# Patient Record
Sex: Female | Born: 1995 | Race: Black or African American | Hispanic: No | Marital: Single | Smoking: Current every day smoker
Health system: Southern US, Community
[De-identification: ages and names within clinical notes are randomized; demographics above are authoritative.]

## PROBLEM LIST (undated history)

## (undated) DIAGNOSIS — F1910 Other psychoactive substance abuse, uncomplicated: Secondary | ICD-10-CM

## (undated) DIAGNOSIS — F419 Anxiety disorder, unspecified: Secondary | ICD-10-CM

## (undated) DIAGNOSIS — J45909 Unspecified asthma, uncomplicated: Secondary | ICD-10-CM

## (undated) HISTORY — PX: KNEE SURGERY: SHX244

---

## 2017-02-16 ENCOUNTER — Emergency Department (HOSPITAL_COMMUNITY): Payer: BLUE CROSS/BLUE SHIELD

## 2017-02-16 ENCOUNTER — Emergency Department (HOSPITAL_COMMUNITY)
Admission: EM | Admit: 2017-02-16 | Discharge: 2017-02-17 | Disposition: A | Discharge status: Home / Self Care | Payer: BLUE CROSS/BLUE SHIELD | Attending: Emergency Medicine | Admitting: Emergency Medicine

## 2017-02-16 ENCOUNTER — Encounter (HOSPITAL_COMMUNITY): Payer: Self-pay | Admitting: Emergency Medicine

## 2017-02-16 DIAGNOSIS — Y999 Unspecified external cause status: Secondary | ICD-10-CM | POA: Diagnosis not present

## 2017-02-16 DIAGNOSIS — J45909 Unspecified asthma, uncomplicated: Secondary | ICD-10-CM | POA: Insufficient documentation

## 2017-02-16 DIAGNOSIS — M25551 Pain in right hip: Secondary | ICD-10-CM

## 2017-02-16 DIAGNOSIS — Y9351 Activity, roller skating (inline) and skateboarding: Secondary | ICD-10-CM | POA: Insufficient documentation

## 2017-02-16 DIAGNOSIS — Y92828 Other wilderness area as the place of occurrence of the external cause: Secondary | ICD-10-CM | POA: Insufficient documentation

## 2017-02-16 DIAGNOSIS — F17200 Nicotine dependence, unspecified, uncomplicated: Secondary | ICD-10-CM | POA: Diagnosis not present

## 2017-02-16 DIAGNOSIS — S79911A Unspecified injury of right hip, initial encounter: Secondary | ICD-10-CM | POA: Diagnosis present

## 2017-02-16 HISTORY — DX: Unspecified asthma, uncomplicated: J45.909

## 2017-02-16 LAB — I-STAT BETA HCG BLOOD, ED (MC, WL, AP ONLY): I-stat hCG, quantitative: <5 m[IU]/mL (ref <5)

## 2017-02-16 MED ORDER — KETOROLAC TROMETHAMINE 60 MG/2ML IM SOLN
60.0000 mg | Freq: Once | INTRAMUSCULAR | Status: AC
  Administered 2017-02-16: 60 mg via INTRAMUSCULAR
  Filled 2017-02-16: qty 2

## 2017-02-16 NOTE — ED Provider Notes (Signed)
[facility] Provider Note   CSN: [id number] Arrival date & time: 02/16/17  4098  By signing my name below, I, Jennifer Walton, attest that this documentation has been prepared under the direction and in the presence of non-physician practitioner, Michela Pitcher, PA-C. Electronically Signed: Modena Walton, Scribe. 02/16/2017. 8:17 PM.  History   Chief Complaint Chief Complaint  Patient presents with  . Hip Pain   The history is provided by the patient. No language interpreter was used.   HPI Comments: Jennifer Walton is a 21 y.o. female who presents to the Emergency Department complaining of constant moderate sharp right hip pain that started about 2 weeks ago. She states while she was skateboarding downhill her skateboard came out from under her, she slipped, rolled over a few times, and fell onto her right hip. She started having pain 2 days after fall. Denies head injury or LOC and pt was ambulatory immediately after fall. She came to the ED since her pain worsened 2 days ago. She took Advil PTA today with some relief. Her pain is exacerbated by any movement and weight-bearing and relieved by staying motionless.  She reports her pain was initially a 2/10 and currently is 7/10 groin pain that does not radiate. Denies any fever, chills, chest pain, SOB, nausea, vomiting, neck pain, back pain, visual disturbance, abdominal pain, urinary symptoms, headache, or other complaints at this time.  Past Medical History:  Diagnosis Date  . Asthma     There are no active problems to display for this patient.   Past Surgical History:  Procedure Laterality Date  . KNEE SURGERY      OB History    Gravida Para Term Preterm AB Living   1             SAB TAB Ectopic Multiple Live Births                   Home Medications    Prior to Admission medications   Not on File    Family History No family history on file.  Social History Social History  Substance Use Topics  . Smoking status:  Current Every Day Smoker  . Smokeless tobacco: Never Used  . Alcohol use Yes     Allergies   Patient has no allergy information on record.   Review of Systems Review of Systems  Constitutional: Negative for chills and fever.  Eyes: Negative for visual disturbance.  Respiratory: Negative for shortness of breath.   Cardiovascular: Negative for chest pain.  Gastrointestinal: Negative for abdominal pain, blood in stool, diarrhea, nausea and vomiting.  Genitourinary: Negative for dysuria and hematuria.  Musculoskeletal: Positive for myalgias (Right hip). Negative for back pain and neck pain.  Neurological: Negative for dizziness, syncope, light-headedness and headaches.     Physical Exam Updated Vital Signs BP 132/74 (BP Location: Left Arm)   Pulse 79   Temp 99 F (37.2 C) (Oral)   Resp 16   Ht 5' 9.5" (1.765 m)   Wt 147 lb (66.7 kg)   LMP 01/31/2017 (Exact Date)   SpO2 100%   BMI 21.40 kg/m   Physical Exam  Constitutional: She is oriented to person, place, and time. She appears well-developed and well-nourished. No distress.  HENT:  Head: Normocephalic and atraumatic.  Eyes: Conjunctivae are normal. Pupils are equal, round, and reactive to light.  Neck: Normal range of motion. Neck supple.  No midline CSP tenderness, no paraspinal muscle spasm or tenderness.   Cardiovascular: Normal  rate, regular rhythm, normal heart sounds and intact distal pulses.   2+ dp/pt pulses bl, negative Homan's bl  Pulmonary/Chest: Effort normal and breath sounds normal.  Abdominal: Soft. Bowel sounds are normal. She exhibits no distension. There is no tenderness.  Musculoskeletal: Normal range of motion. She exhibits no edema, tenderness or deformity.  No midline LSP ttp. No paraspinal muscle ttp. No deformity, or crepitus noted. Full ROM of right hip. No pain on palpation of hip or groin. No masses or bulging noted. No erythema. Pain on extension of hip, walking, standing, and forward flexion.  Compartments soft.  Neurological: She is alert and oriented to person, place, and time. No cranial nerve deficit or sensory deficit.  No facial droop, fluent speech, antalgic gait, but able to heel walk and toe walk. Sensation intact globally  Skin: Skin is warm and dry. Capillary refill takes less than 2 seconds.  Well-healing superficial abrasion to right posterior hip, no erythema, drainage or bleeding. No other signs of trauma or wound noted.   Psychiatric: She has a normal mood and affect. Her behavior is normal.  Nursing note and vitals reviewed.    ED Treatments / Results  DIAGNOSTIC STUDIES: Oxygen Saturation is 100% on RA, normal by my interpretation.    COORDINATION OF CARE: 8:21 PM- Pt advised of plan for treatment and pt agrees.  Labs (all labs ordered are listed, but only abnormal results are displayed) Labs Reviewed  I-STAT BETA HCG BLOOD, ED ([facility], [facility], [facility] ONLY)    EKG  EKG Interpretation None       Radiology Ct Pelvis Wo Contrast  Result Date: 02/16/2017 CLINICAL DATA:  Acute onset of right hip pain, after fall off skateboard. Initial encounter. EXAM: CT PELVIS WITHOUT CONTRAST TECHNIQUE: Multidetector CT imaging of the pelvis was performed following the standard protocol without intravenous contrast. COMPARISON:  None. FINDINGS: Urinary Tract: The bladder is mildly distended and grossly unremarkable. Bowel: Visualized small and large bowel loops are grossly unremarkable in appearance. The appendix is normal in caliber, without evidence of appendicitis. Vascular/Lymphatic: The visualized vasculature is grossly unremarkable. No calcific atherosclerotic disease is seen. No retroperitoneal or pelvic sidewall lymphadenopathy is appreciated. Reproductive: The uterus is unremarkable in appearance. The ovaries are relatively symmetric. No suspicious adnexal masses are seen. Other: Mild soft tissue injury is noted at the lower right flank, with a tiny focus of air 4 cm deep to  the skin surface. Would correlate for any underlying puncture wound. Musculoskeletal: No acute osseous abnormalities are identified. The visualized musculature is unremarkable in appearance. IMPRESSION: 1. No evidence of fracture. 2. Mild soft tissue injury at the right lower flank, with a tiny focus of air 4 cm deep to the skin surface. Would correlate for any underlying puncture wound. Electronically Signed   By: Roanna Raider M.D.   On: 02/16/2017 23:29   Dg Hip Unilat With Pelvis 2-3 Views Right  Result Date: 02/16/2017 CLINICAL DATA:  Right hip pain after fall from skateboard 2 weeks ago. No improvement. Painful to bear weight or to walk. EXAM: DG HIP (WITH OR WITHOUT PELVIS) 2-3V RIGHT COMPARISON:  None. FINDINGS: There is no evidence of hip fracture or dislocation. There is no evidence of arthropathy or other focal bone abnormality. IMPRESSION: Negative. Electronically Signed   By: Burman Nieves M.D.   On: 02/16/2017 21:13    Procedures Procedures (including critical care time)  Medications Ordered in ED Medications  ketorolac (TORADOL) injection 60 mg (60 mg Intramuscular Given 02/16/17 2112)  Initial Impression / Assessment and Plan / ED Course  I have reviewed the triage vital signs and the nursing notes.  Pertinent labs & imaging results that were available during my care of the patient were reviewed by me and considered in my medical decision making (see chart for details).     Pt with 12 day history progressively worsening right hip pain after falling off skateboard. Afebrile, VSS, neurovascularly intact. Strength intact. Patient X-Ray negative for obvious fracture or dislocation. Obtained CT pelvis to definitively rule out fracture which was negative. Low suspicion AVN of the femoral head, femoral hernia, or DVT. Pain managed in ED. Pt advised to follow up with orthopedics for re-evaluation for possibility of missed fracture diagnosis. Patient given crutches while in ED,  conservative therapy recommended and discussed. Patient will be dc home & is agreeable with above plan.  Final Clinical Impressions(s) / ED Diagnoses   Final diagnoses:  Right hip pain    New Prescriptions There are no discharge medications for this patient.  I personally performed the services described in this documentation, which was scribed in my presence. The recorded information has been reviewed and is accurate.     Jeanie Sewer, PA-C 02/18/17 1358    Canary Brim Tegeler, MD 02/18/17 [phone]

## 2017-02-16 NOTE — ED Triage Notes (Signed)
Pt st's she fell off her skate board 2 weeks ago and started having pain in right hip 2 days later.  Pt st's pain in right hip has not improved and is very pain to bear wt or walk

## 2017-02-16 NOTE — ED Notes (Signed)
Patient transported to CT 

## 2017-06-01 ENCOUNTER — Ambulatory Visit (HOSPITAL_COMMUNITY)
Admission: EM | Admit: 2017-06-01 | Discharge: 2017-06-01 | Disposition: A | Discharge status: Home / Self Care | Payer: BLUE CROSS/BLUE SHIELD | Attending: Family Medicine | Admitting: Family Medicine

## 2017-06-01 ENCOUNTER — Encounter (HOSPITAL_COMMUNITY): Payer: Self-pay | Admitting: Emergency Medicine

## 2017-06-01 DIAGNOSIS — G43A0 Cyclical vomiting, not intractable: Secondary | ICD-10-CM | POA: Diagnosis not present

## 2017-06-01 DIAGNOSIS — R1115 Cyclical vomiting syndrome unrelated to migraine: Secondary | ICD-10-CM

## 2017-06-01 LAB — POCT PREGNANCY, URINE: PREG TEST UR: NEGATIVE

## 2017-06-01 MED ORDER — ONDANSETRON 4 MG PO TBDP: 4.0000 mg | ORAL_TABLET | Freq: Three times a day (TID) | ORAL | 0 refills | Status: DC | PRN

## 2017-06-01 NOTE — ED Provider Notes (Signed)
CSN: [id number]     Arrival date & time 06/01/17  1854 History   None    Chief Complaint  Patient presents with  . Emesis   (Consider location/radiation/quality/duration/timing/severity/associated sxs/prior Treatment) Patient c/o nausea. She states it may be from her bcp's.   The history is provided by the patient.  Emesis  Severity:  Moderate Duration:  1 day Timing:  Constant Worsened by:  Nothing Ineffective treatments:  None tried   Past Medical History:  Diagnosis Date  . Asthma    Past Surgical History:  Procedure Laterality Date  . KNEE SURGERY     No family history on file. Social History  Substance Use Topics  . Smoking status: Current Every Day Smoker    Packs/day: 0.20    Types: Cigarettes  . Smokeless tobacco: Never Used  . Alcohol use Yes   OB History    Gravida Para Term Preterm AB Living   1             SAB TAB Ectopic Multiple Live Births                 Review of Systems  Constitutional: Negative.   HENT: Negative.   Eyes: Negative.   Respiratory: Negative.   Cardiovascular: Negative.   Gastrointestinal: Positive for vomiting.  Endocrine: Negative.   Genitourinary: Negative.   Allergic/Immunologic: Negative.   Neurological: Negative.     Allergies  Patient has no known allergies.  Home Medications   Prior to Admission medications   Medication Sig Start Date End Date Taking? Authorizing Provider  Etonogestrel (NEXPLANON [place]) Inject into the skin.    [provider]  ondansetron (ZOFRAN ODT) 4 MG disintegrating tablet Take 1 tablet (4 mg total) by mouth every 8 (eight) hours as needed for nausea or vomiting. 06/01/17   Deatra Canterxford, Roberth Berling J, FNP   Meds Ordered and Administered this Visit  Medications - No data to display  BP 136/76   Pulse 75   Temp 98.8 F (37.1 C) (Oral)   Resp 16   Ht 5\' 9"  (1.753 m)   Wt 131 lb (59.4 kg)   LMP 04/06/2017   SpO2 100%   BMI 19.35 kg/m  No data found.   Physical Exam   Constitutional: She appears well-developed and well-nourished.  HENT:  Head: Normocephalic and atraumatic.  Eyes: Pupils are equal, round, and reactive to light. Conjunctivae and EOM are normal.  Neck: Normal range of motion. Neck supple.  Cardiovascular: Normal rate, regular rhythm and normal heart sounds.   Pulmonary/Chest: Effort normal and breath sounds normal.  Abdominal: Soft. Bowel sounds are normal.  Nursing note and vitals reviewed.   Urgent Care Course     Procedures (including critical care time)  Labs Review Labs Reviewed  POCT PREGNANCY, URINE    Imaging Review No results found.   Visual Acuity Review  Right Eye Distance:   Left Eye Distance:   Bilateral Distance:    Right Eye Near:   Left Eye Near:    Bilateral Near:         MDM   1. Non-intractable cyclical vomiting with nausea    Zofran ODT 4mg  one po tid prn #21   Deatra CanterOxford, Angelin Cutrone J, FNP 06/01/17 2016

## 2017-06-01 NOTE — ED Triage Notes (Signed)
PT reports she got nexplanon implanted 6 weeks ago. Two weeks after that she began having intermittent vomiting. PT reports some diarrhea as well

## 2017-12-01 ENCOUNTER — Encounter (HOSPITAL_COMMUNITY): Payer: Self-pay | Admitting: Family Medicine

## 2017-12-01 ENCOUNTER — Ambulatory Visit (HOSPITAL_COMMUNITY)
Admission: EM | Admit: 2017-12-01 | Discharge: 2017-12-01 | Disposition: A | Discharge status: Home / Self Care | Payer: BLUE CROSS/BLUE SHIELD | Attending: Family Medicine | Admitting: Family Medicine

## 2017-12-01 DIAGNOSIS — Z202 Contact with and (suspected) exposure to infections with a predominantly sexual mode of transmission: Secondary | ICD-10-CM | POA: Insufficient documentation

## 2017-12-01 DIAGNOSIS — Z3202 Encounter for pregnancy test, result negative: Secondary | ICD-10-CM

## 2017-12-01 DIAGNOSIS — Z711 Person with feared health complaint in whom no diagnosis is made: Secondary | ICD-10-CM

## 2017-12-01 DIAGNOSIS — Z113 Encounter for screening for infections with a predominantly sexual mode of transmission: Secondary | ICD-10-CM | POA: Diagnosis not present

## 2017-12-01 DIAGNOSIS — F17210 Nicotine dependence, cigarettes, uncomplicated: Secondary | ICD-10-CM | POA: Insufficient documentation

## 2017-12-01 DIAGNOSIS — Z79899 Other long term (current) drug therapy: Secondary | ICD-10-CM | POA: Diagnosis not present

## 2017-12-01 DIAGNOSIS — Z8619 Personal history of other infectious and parasitic diseases: Secondary | ICD-10-CM | POA: Diagnosis not present

## 2017-12-01 LAB — POCT URINALYSIS DIP (DEVICE)
BILIRUBIN URINE: NEGATIVE
Glucose, UA: NEGATIVE mg/dL
HGB URINE DIPSTICK: NEGATIVE
KETONES UR: NEGATIVE mg/dL
Leukocytes, UA: NEGATIVE
Nitrite: NEGATIVE
PH: 7 (ref 5.0–8.0)
Protein, ur: NEGATIVE mg/dL
Specific Gravity, Urine: 1.02 (ref 1.005–1.030)
Urobilinogen, UA: 0.2 mg/dL (ref 0.0–1.0)

## 2017-12-01 LAB — POCT PREGNANCY, URINE: PREG TEST UR: NEGATIVE

## 2017-12-01 MED ORDER — CEFTRIAXONE SODIUM 250 MG IJ SOLR
250.0000 mg | Freq: Once | INTRAMUSCULAR | Status: AC
  Administered 2017-12-01: 250 mg via INTRAMUSCULAR

## 2017-12-01 MED ORDER — AZITHROMYCIN 250 MG PO TABS
ORAL_TABLET | ORAL | Status: AC
  Filled 2017-12-01: qty 4

## 2017-12-01 MED ORDER — CEFTRIAXONE SODIUM 250 MG IJ SOLR
INTRAMUSCULAR | Status: AC
  Filled 2017-12-01: qty 250

## 2017-12-01 MED ORDER — AZITHROMYCIN 250 MG PO TABS
1000.0000 mg | ORAL_TABLET | Freq: Once | ORAL | Status: AC
  Administered 2017-12-01: 1000 mg via ORAL

## 2017-12-01 NOTE — Discharge Instructions (Signed)
We have treated you for gonorrhea and chlamydia.  Will notify you of any positive findings and if any changes to treatment are needed.   Withhold from intercourse for the next week. Please use condoms to prevent spread of std's.

## 2017-12-01 NOTE — ED Provider Notes (Signed)
[facility]    CSN: [id number] Arrival date & time: 12/01/17  1009     History   Chief Complaint Chief Complaint  Patient presents with  . Exposure to STD    HPI Jennifer Walton is a 22 y.o. female.   [name] presents with concerns about STD exposure. States her boyfriend has developed urinary symptoms and is currently being tested. She has 3 partners, does not use condoms. States has noted vaginal odor. Without increased vaginal discharge, itching or burning. Denies urinary symptoms, back pain, abdominal pain or fevers. Has had chlamydia in the past. Takes zoloft daily, otherwise no daily medications.    ROS per HPI.       Past Medical History:  Diagnosis Date  . Asthma     There are no active problems to display for this patient.   Past Surgical History:  Procedure Laterality Date  . KNEE SURGERY      OB History    Gravida Para Term Preterm AB Living   1             SAB TAB Ectopic Multiple Live Births                   Home Medications    Prior to Admission medications   Medication Sig Start Date End Date Taking? Authorizing Provider  sertraline (ZOLOFT) 25 MG tablet Take 25 mg by mouth daily.   Yes [provider]  Etonogestrel (NEXPLANON [place]) Inject into the skin.    [provider]  ondansetron (ZOFRAN ODT) 4 MG disintegrating tablet Take 1 tablet (4 mg total) by mouth every 8 (eight) hours as needed for nausea or vomiting. 06/01/17   Deatra Canter, FNP    Family History History reviewed. No pertinent family history.  Social History Social History   Tobacco Use  . Smoking status: Current Every Day Smoker    Packs/day: 0.20    Types: Cigarettes  . Smokeless tobacco: Never Used  Substance Use Topics  . Alcohol use: Yes  . Drug use: No     Allergies   Patient has no known allergies.   Review of Systems Review of Systems   Physical Exam Triage Vital Signs ED Triage Vitals [12/01/17 1108]  Enc Vitals Group      BP 136/80     Pulse Rate 71     Resp 18     Temp 98.5 F (36.9 C)     Temp src      SpO2 100 %     Weight      Height      Head Circumference      Peak Flow      Pain Score      Pain Loc      Pain Edu?      Excl. in GC?    No data found.  Updated Vital Signs BP 136/80   Pulse 71   Temp 98.5 F (36.9 C)   Resp 18   SpO2 100%   Breastfeeding? Unknown   Visual Acuity Right Eye Distance:   Left Eye Distance:   Bilateral Distance:    Right Eye Near:   Left Eye Near:    Bilateral Near:     Physical Exam  Constitutional: She is oriented to person, place, and time. She appears well-developed and well-nourished. No distress.  Cardiovascular: Normal rate, regular rhythm and normal heart sounds.  Pulmonary/Chest: Effort normal and breath sounds normal.  Abdominal: Soft. She  exhibits no mass. There is no tenderness. There is no rebound and no guarding.  Genitourinary:  Genitourinary Comments: Denies lesions; discharge; without pain; gu exam deferred   Neurological: She is alert and oriented to person, place, and time.  Skin: Skin is warm and dry.     UC Treatments / Results  Labs (all labs ordered are listed, but only abnormal results are displayed) Labs Reviewed  URINE CYTOLOGY ANCILLARY ONLY    EKG  EKG Interpretation None       Radiology No results found.  Procedures Procedures (including critical care time)  Medications Ordered in UC Medications  azithromycin (ZITHROMAX) tablet 1,000 mg (not administered)  cefTRIAXone (ROCEPHIN) injection 250 mg (not administered)     Initial Impression / Assessment and Plan / UC Course  I have reviewed the triage vital signs and the nursing notes.  Pertinent labs & imaging results that were available during my care of the patient were reviewed by me and considered in my medical decision making (see chart for details).     Patient agrees to treatment with azithromycin and rocephin empirically at this  time. Urine cytology pending. Will notify of any positive findings and if any changes to treatment are needed.  Withhold from intercourse for the next week. Encouraged condom use. If symptoms worsen or do not improve in the next week to return to be seen or to follow up with PCP. Patient verbalized understanding and agreeable to plan.    Final Clinical Impressions(s) / UC Diagnoses   Final diagnoses:  Concern about STD in female without diagnosis    ED Discharge Orders    None       Controlled Substance Prescriptions [place] Controlled Substance Registry consulted? Not Applicable   Georgetta HaberBurky, Maury Groninger B, NP 12/01/17 1129

## 2017-12-01 NOTE — ED Triage Notes (Signed)
Pt reports she is here because she was exposed to chlamydia.

## 2017-12-02 LAB — URINE CYTOLOGY ANCILLARY ONLY
Chlamydia: POSITIVE — AB
Neisseria Gonorrhea: NEGATIVE
Trichomonas: NEGATIVE

## 2017-12-04 ENCOUNTER — Emergency Department (HOSPITAL_COMMUNITY)
Admission: EM | Admit: 2017-12-04 | Discharge: 2017-12-05 | Disposition: A | Discharge status: Other Acute Inpatient Hospital | Payer: BLUE CROSS/BLUE SHIELD | Attending: Emergency Medicine | Admitting: Emergency Medicine

## 2017-12-04 ENCOUNTER — Encounter (HOSPITAL_COMMUNITY): Payer: Self-pay | Admitting: Emergency Medicine

## 2017-12-04 DIAGNOSIS — Z79899 Other long term (current) drug therapy: Secondary | ICD-10-CM | POA: Insufficient documentation

## 2017-12-04 DIAGNOSIS — J45909 Unspecified asthma, uncomplicated: Secondary | ICD-10-CM | POA: Insufficient documentation

## 2017-12-04 DIAGNOSIS — F332 Major depressive disorder, recurrent severe without psychotic features: Secondary | ICD-10-CM | POA: Diagnosis not present

## 2017-12-04 DIAGNOSIS — F17210 Nicotine dependence, cigarettes, uncomplicated: Secondary | ICD-10-CM | POA: Insufficient documentation

## 2017-12-04 DIAGNOSIS — F322 Major depressive disorder, single episode, severe without psychotic features: Secondary | ICD-10-CM

## 2017-12-04 DIAGNOSIS — R45851 Suicidal ideations: Secondary | ICD-10-CM | POA: Diagnosis not present

## 2017-12-04 DIAGNOSIS — F329 Major depressive disorder, single episode, unspecified: Secondary | ICD-10-CM | POA: Diagnosis present

## 2017-12-04 LAB — COMPREHENSIVE METABOLIC PANEL
ALT: 13 U/L — AB (ref 14–54)
AST: 23 U/L (ref 15–41)
Albumin: 4.6 g/dL (ref 3.5–5.0)
Alkaline Phosphatase: 40 U/L (ref 38–126)
Anion gap: 12 (ref 5–15)
BILIRUBIN TOTAL: 1.3 mg/dL — AB (ref 0.3–1.2)
BUN: 5 mg/dL — AB (ref 6–20)
CALCIUM: 9.7 mg/dL (ref 8.9–10.3)
CO2: 24 mmol/L (ref 22–32)
CREATININE: 0.62 mg/dL (ref 0.44–1.00)
Chloride: 102 mmol/L (ref 101–111)
GFR calc Af Amer: >60 mL/min (ref >60)
Glucose, Bld: 77 mg/dL (ref 65–99)
Potassium: 4 mmol/L (ref 3.5–5.1)
Sodium: 138 mmol/L (ref 135–145)
TOTAL PROTEIN: 7.6 g/dL (ref 6.5–8.1)

## 2017-12-04 LAB — ACETAMINOPHEN LEVEL: Acetaminophen (Tylenol), Serum: <10 ug/mL — ABNORMAL LOW (ref 10–30)

## 2017-12-04 LAB — CBC
HCT: 40.9 % (ref 36.0–46.0)
Hemoglobin: 13.7 g/dL (ref 12.0–15.0)
MCH: 31.6 pg (ref 26.0–34.0)
MCHC: 33.5 g/dL (ref 30.0–36.0)
MCV: 94.5 fL (ref 78.0–100.0)
PLATELETS: 270 10*3/uL (ref 150–400)
RBC: 4.33 MIL/uL (ref 3.87–5.11)
RDW: 12.3 % (ref 11.5–15.5)
WBC: 8.7 10*3/uL (ref 4.0–10.5)

## 2017-12-04 LAB — RAPID URINE DRUG SCREEN, HOSP PERFORMED
Amphetamines: NOT DETECTED
Barbiturates: NOT DETECTED
Benzodiazepines: POSITIVE — AB
Cocaine: NOT DETECTED
Opiates: NOT DETECTED
Tetrahydrocannabinol: NOT DETECTED

## 2017-12-04 LAB — I-STAT BETA HCG BLOOD, ED (MC, WL, AP ONLY): I-stat hCG, quantitative: <5 m[IU]/mL (ref <5)

## 2017-12-04 LAB — SALICYLATE LEVEL: Salicylate Lvl: <7 mg/dL (ref 2.8–30.0)

## 2017-12-04 LAB — ETHANOL: ALCOHOL ETHYL (B): 32 mg/dL — AB (ref <10)

## 2017-12-04 MED ORDER — ACETAMINOPHEN 325 MG PO TABS: 650.0000 mg | ORAL_TABLET | ORAL | Status: DC | PRN

## 2017-12-04 MED ORDER — ZOLPIDEM TARTRATE 5 MG PO TABS
5.0000 mg | ORAL_TABLET | Freq: Every evening | ORAL | Status: DC | PRN
Start: 2017-12-04 — End: 2017-12-05

## 2017-12-04 MED ORDER — ONDANSETRON HCL 4 MG PO TABS: 4.0000 mg | ORAL_TABLET | Freq: Three times a day (TID) | ORAL | Status: DC | PRN

## 2017-12-04 NOTE — ED Notes (Signed)
Meal Tray ordered.  

## 2017-12-04 NOTE — ED Notes (Signed)
Sitter at bedside.

## 2017-12-04 NOTE — ED Provider Notes (Signed)
[facility] EMERGENCY DEPARTMENT Provider Note   CSN: [id number] Arrival date & time: 12/04/17  0110     History   Chief Complaint Chief Complaint  Patient presents with  . Suicidal    HPI Jennifer Walton is a 22 y.o. female.  HPI   22 year old female with history of depression comes in with chief complaint of suicidal ideation.  Patient reports that she has been feeling depressed over the last few days, and now having suicidal thoughts.  Patient has not required admission to a psychiatric ward in the last 1 year, and she denies any previous attempts.  Patient denies any active substance abuse.  Patient also denies any specific trigger for her decline.  Pt has no chest pain, dib, abd pain.   Past Medical History:  Diagnosis Date  . Asthma     There are no active problems to display for this patient.   Past Surgical History:  Procedure Laterality Date  . KNEE SURGERY      OB History    Gravida Para Term Preterm AB Living   1             SAB TAB Ectopic Multiple Live Births                   Home Medications    Prior to Admission medications   Medication Sig Start Date End Date Taking? Authorizing Provider  Etonogestrel (NEXPLANON [place]) Inject into the skin.    [provider]  ondansetron (ZOFRAN ODT) 4 MG disintegrating tablet Take 1 tablet (4 mg total) by mouth every 8 (eight) hours as needed for nausea or vomiting. 06/01/17   Deatra Canter, FNP  sertraline (ZOLOFT) 25 MG tablet Take 25 mg by mouth daily.    [provider]    Family History No family history on file.  Social History Social History   Tobacco Use  . Smoking status: Current Every Day Smoker    Packs/day: 0.20    Types: Cigarettes  . Smokeless tobacco: Never Used  Substance Use Topics  . Alcohol use: Yes  . Drug use: No     Allergies   Patient has no known allergies.   Review of Systems Review of Systems  Constitutional: Positive for activity  change.  Respiratory: Negative for shortness of breath.   Cardiovascular: Negative for chest pain.  Gastrointestinal: Negative for abdominal pain.  Psychiatric/Behavioral: Positive for dysphoric mood and suicidal ideas.  All other systems reviewed and are negative.    Physical Exam Updated Vital Signs BP 122/76 (BP Location: Right Arm)   Pulse 86   Temp 97.7 F (36.5 C) (Oral)   Resp 15   Ht 5\' 9"  (1.753 m)   Wt 63.5 kg (140 lb)   SpO2 100%   BMI 20.67 kg/m   Physical Exam  Constitutional: She is oriented to person, place, and time. She appears well-developed.  HENT:  Head: Normocephalic and atraumatic.  Eyes: EOM are normal.  Neck: Normal range of motion. Neck supple.  Cardiovascular: Normal rate.  Pulmonary/Chest: Effort normal.  Abdominal: Bowel sounds are normal.  Neurological: She is alert and oriented to person, place, and time.  Skin: Skin is warm and dry.  Psychiatric:  Flat affect  Nursing note and vitals reviewed.    ED Treatments / Results  Labs (all labs ordered are listed, but only abnormal results are displayed) Labs Reviewed  COMPREHENSIVE METABOLIC PANEL - Abnormal; Notable for the following components:  Result Value   BUN 5 (*)    ALT 13 (*)    Total Bilirubin 1.3 (*)    All other components within normal limits  ETHANOL - Abnormal; Notable for the following components:   Alcohol, Ethyl (B) 32 (*)    All other components within normal limits  ACETAMINOPHEN LEVEL - Abnormal; Notable for the following components:   Acetaminophen (Tylenol), Serum <10 (*)    All other components within normal limits  RAPID URINE DRUG SCREEN, HOSP PERFORMED - Abnormal; Notable for the following components:   Benzodiazepines POSITIVE (*)    All other components within normal limits  SALICYLATE LEVEL  CBC  I-STAT BETA HCG BLOOD, ED ([facility], [facility], [facility] ONLY)    EKG  EKG Interpretation None       Radiology No results found.  Procedures Procedures  (including critical care time)  Medications Ordered in ED Medications  acetaminophen (TYLENOL) tablet 650 mg (not administered)  zolpidem (AMBIEN) tablet 5 mg (not administered)  ondansetron (ZOFRAN) tablet 4 mg (not administered)     Initial Impression / Assessment and Plan / ED Course  I have reviewed the triage vital signs and the nursing notes.  Pertinent labs & imaging results that were available during my care of the patient were reviewed by me and considered in my medical decision making (see chart for details).     22 year old comes in with chief complaint of suicidal thoughts and worsening depression.  Patient has been noncompliant with her medications over the past few days.  She denies any acute triggers for her current situation.  Patient is medically cleared for psychiatry evaluation.  Final Clinical Impressions(s) / ED Diagnoses   Final diagnoses:  Suicidal ideation  Current severe episode of major depressive disorder without psychotic features, unspecified whether recurrent [facility](HCC)    ED Discharge Orders    None       Derwood KaplanNanavati, Dalan Cowger, MD 12/04/17 [phone]

## 2017-12-04 NOTE — ED Notes (Signed)
Pt ambulatory to F11 - wearing burgundy scrubs.

## 2017-12-04 NOTE — ED Notes (Signed)
Pt's female friend who stated he has her belongings and advised he will come visit pt at 1730 visitation time.

## 2017-12-04 NOTE — ED Notes (Signed)
Pt given paper and pencil upon request.
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
## 2017-12-04 NOTE — ED Notes (Signed)
Called staffing for sitter 

## 2017-12-04 NOTE — ED Triage Notes (Signed)
Pt presents with female friend, pt appears very drowsy, stating she had taken 1mg  Xanax tonight. Pt reports she does have hx of depression, she had a bad day yesterday, felt as if she was "spiraling" and felt "very suicidal" no plan. Pt states she needs to be put on a 72 hr hold for safety.

## 2017-12-04 NOTE — ED Notes (Signed)
Patient made a call [phone] at [phone].

## 2017-12-04 NOTE — ED Notes (Signed)
Pt clothing and shoes inventoried. No valuables present.
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
## 2017-12-05 ENCOUNTER — Other Ambulatory Visit: Payer: Self-pay

## 2017-12-05 ENCOUNTER — Inpatient Hospital Stay (HOSPITAL_COMMUNITY)
Admission: AD | Admit: 2017-12-05 | Discharge: 2017-12-08 | DRG: 885 | Disposition: A | Discharge status: Home / Self Care | Payer: BLUE CROSS/BLUE SHIELD | Source: Intra-hospital | Attending: Psychiatry | Admitting: Psychiatry

## 2017-12-05 ENCOUNTER — Encounter (HOSPITAL_COMMUNITY): Payer: Self-pay | Admitting: *Deleted

## 2017-12-05 DIAGNOSIS — G4710 Hypersomnia, unspecified: Secondary | ICD-10-CM | POA: Diagnosis present

## 2017-12-05 DIAGNOSIS — Z818 Family history of other mental and behavioral disorders: Secondary | ICD-10-CM | POA: Diagnosis not present

## 2017-12-05 DIAGNOSIS — F419 Anxiety disorder, unspecified: Secondary | ICD-10-CM | POA: Diagnosis not present

## 2017-12-05 DIAGNOSIS — Z62810 Personal history of physical and sexual abuse in childhood: Secondary | ICD-10-CM | POA: Diagnosis present

## 2017-12-05 DIAGNOSIS — J45909 Unspecified asthma, uncomplicated: Secondary | ICD-10-CM | POA: Diagnosis present

## 2017-12-05 DIAGNOSIS — Y901 Blood alcohol level of 20-39 mg/100 ml: Secondary | ICD-10-CM | POA: Diagnosis present

## 2017-12-05 DIAGNOSIS — Z811 Family history of alcohol abuse and dependence: Secondary | ICD-10-CM

## 2017-12-05 DIAGNOSIS — F1010 Alcohol abuse, uncomplicated: Secondary | ICD-10-CM | POA: Diagnosis present

## 2017-12-05 DIAGNOSIS — Z9689 Presence of other specified functional implants: Secondary | ICD-10-CM | POA: Diagnosis present

## 2017-12-05 DIAGNOSIS — F410 Panic disorder [episodic paroxysmal anxiety] without agoraphobia: Secondary | ICD-10-CM | POA: Diagnosis present

## 2017-12-05 DIAGNOSIS — F17290 Nicotine dependence, other tobacco product, uncomplicated: Secondary | ICD-10-CM | POA: Diagnosis present

## 2017-12-05 DIAGNOSIS — Z566 Other physical and mental strain related to work: Secondary | ICD-10-CM

## 2017-12-05 DIAGNOSIS — Z79899 Other long term (current) drug therapy: Secondary | ICD-10-CM | POA: Diagnosis not present

## 2017-12-05 DIAGNOSIS — Z598 Other problems related to housing and economic circumstances: Secondary | ICD-10-CM | POA: Diagnosis not present

## 2017-12-05 DIAGNOSIS — F332 Major depressive disorder, recurrent severe without psychotic features: Principal | ICD-10-CM | POA: Diagnosis present

## 2017-12-05 DIAGNOSIS — Z630 Problems in relationship with spouse or partner: Secondary | ICD-10-CM | POA: Diagnosis not present

## 2017-12-05 DIAGNOSIS — Z62819 Personal history of unspecified abuse in childhood: Secondary | ICD-10-CM | POA: Diagnosis not present

## 2017-12-05 DIAGNOSIS — Z793 Long term (current) use of hormonal contraceptives: Secondary | ICD-10-CM | POA: Diagnosis not present

## 2017-12-05 DIAGNOSIS — G4700 Insomnia, unspecified: Secondary | ICD-10-CM | POA: Diagnosis present

## 2017-12-05 DIAGNOSIS — R45851 Suicidal ideations: Secondary | ICD-10-CM | POA: Diagnosis present

## 2017-12-05 DIAGNOSIS — Z5689 Other problems related to employment: Secondary | ICD-10-CM | POA: Diagnosis not present

## 2017-12-05 LAB — URINE CYTOLOGY ANCILLARY ONLY: Candida vaginitis: NEGATIVE

## 2017-12-05 MED ORDER — ONDANSETRON HCL 4 MG PO TABS: 4.0000 mg | ORAL_TABLET | Freq: Three times a day (TID) | ORAL | Status: DC | PRN

## 2017-12-05 MED ORDER — TRAZODONE HCL 50 MG PO TABS
50.0000 mg | ORAL_TABLET | Freq: Every evening | ORAL | Status: DC | PRN
  Administered 2017-12-05 – 2017-12-07 (×2): 50 mg via ORAL
  Filled 2017-12-05 (×2): qty 1

## 2017-12-05 MED ORDER — ALUM & MAG HYDROXIDE-SIMETH 200-200-20 MG/5ML PO SUSP: 30.0000 mL | ORAL | Status: DC | PRN

## 2017-12-05 MED ORDER — ACETAMINOPHEN 325 MG PO TABS
650.0000 mg | ORAL_TABLET | Freq: Four times a day (QID) | ORAL | Status: DC | PRN
Start: 2017-12-05 — End: 2017-12-08

## 2017-12-05 MED ORDER — MAGNESIUM HYDROXIDE 400 MG/5ML PO SUSP: 30.0000 mL | Freq: Every day | ORAL | Status: DC | PRN

## 2017-12-05 NOTE — ED Notes (Signed)
[facility]

## 2017-12-05 NOTE — ED Notes (Signed)
Patient used phone at 1030 am.

## 2017-12-05 NOTE — Progress Notes (Signed)
Jennifer Walton is a 22 year old female pt admitted on voluntary basis. On admission, Jennifer Walton presents as calm and cooperative and reports that she wanted to come in for a few days. She reports that she has been feeling depressed and did report she made a suicidal statement but denies any SI on admission and is able to contract for safety while in the hospital. She reports that she receives medications from Dr. Jannifer FranklinAkintayo and reports she takes her medications as prescribed. She reports that she lives on campus at [employer] and reports that she will go back there once she is discharged. Jennifer Walton was oriented to the unit and safety maintained.

## 2017-12-05 NOTE — Progress Notes (Signed)
[facility] Patient up and visible in the milieu. Bel[facility] by security and [facility] St[facility] locker 16 - see itemized sheet in paper chart. Patient observed social with peers. Depressed, anxious however pleasant and cooperative. No medications ordered at this time. No prns requested or required. Remains safe on level III obs.

## 2017-12-05 NOTE — ED Notes (Signed)
Patient don't eat Meat.

## 2017-12-05 NOTE — [facility] Group Notes (Signed)
[facility] Group Notes:  (Nursing/MHT/Case Management/Adjunct)  Date:  12/05/2017  Time:  1615  Type of Therapy:  Nurse Education - Positive Affirmations and Self Talk  Participation Level:  Active  Participation Quality:  Attentive  Affect:  Blunted  Cognitive:  Alert  Insight:  Lacking  Engagement in Group:  Engaged  Modes of Intervention:  Education and Support  Summary of Progress/Problems: Patient attended group, was somewhat attentive however did not contribute to discussion.  Lawrence MarseillesFriedman, Laurian Edrington Eakes 12/05/2017, 5:39 PM

## 2017-12-05 NOTE — ED Notes (Signed)
[facility], all pt's belonings were taken home previously by family.

## 2017-12-05 NOTE — Tx Team (Signed)
Initial Treatment Plan 12/05/2017 2:43 PM Jennifer LoserAron Walton [id number]    PATIENT STRESSORS: Educational concerns Marital or family conflict Occupational concerns   PATIENT STRENGTHS: Ability for insight Average or above average intelligence Capable of independent living General fund of knowledge Motivation for treatment/growth   PATIENT IDENTIFIED PROBLEMS: Depression Suicidal thoughts "Stop letting my emotions get the best of me"                     DISCHARGE CRITERIA:  Ability to meet basic life and health needs Improved stabilization in mood, thinking, and/or behavior Reduction of life-threatening or endangering symptoms to within safe limits Verbal commitment to aftercare and medication compliance  PRELIMINARY DISCHARGE PLAN: Attend aftercare/continuing care group Return to previous living arrangement  PATIENT/FAMILY INVOLVEMENT: This treatment plan has been presented to and reviewed with the patient, Jennifer Loserron Walton, and/or family member, .  The patient and family have been given the opportunity to ask questions and make suggestions.  Jennifer Walton, [name], [place] 12/05/2017, 2:43 PM

## 2017-12-05 NOTE — ED Notes (Signed)
Regular Diet was ordered for Lunch, Patient Doesn't eat Meat. Salad w/o  meat was ordered.

## 2017-12-05 NOTE — ED Notes (Signed)
Patient Breakfast had came earlier, but she doesn't eat Meat , so another order was ordered for patient for breakfast w/o meat.

## 2017-12-06 DIAGNOSIS — Z62819 Personal history of unspecified abuse in childhood: Secondary | ICD-10-CM

## 2017-12-06 DIAGNOSIS — F17290 Nicotine dependence, other tobacco product, uncomplicated: Secondary | ICD-10-CM

## 2017-12-06 DIAGNOSIS — R45851 Suicidal ideations: Secondary | ICD-10-CM

## 2017-12-06 DIAGNOSIS — Z811 Family history of alcohol abuse and dependence: Secondary | ICD-10-CM

## 2017-12-06 DIAGNOSIS — Z5689 Other problems related to employment: Secondary | ICD-10-CM

## 2017-12-06 DIAGNOSIS — Z630 Problems in relationship with spouse or partner: Secondary | ICD-10-CM

## 2017-12-06 DIAGNOSIS — Z818 Family history of other mental and behavioral disorders: Secondary | ICD-10-CM

## 2017-12-06 DIAGNOSIS — F332 Major depressive disorder, recurrent severe without psychotic features: Principal | ICD-10-CM

## 2017-12-06 DIAGNOSIS — F1010 Alcohol abuse, uncomplicated: Secondary | ICD-10-CM

## 2017-12-06 MED ORDER — HYDROXYZINE HCL 25 MG PO TABS
25.0000 mg | ORAL_TABLET | Freq: Four times a day (QID) | ORAL | Status: DC | PRN
  Administered 2017-12-06 – 2017-12-07 (×2): 25 mg via ORAL
  Filled 2017-12-06 (×2): qty 1

## 2017-12-06 MED ORDER — SERTRALINE HCL 50 MG PO TABS
50.0000 mg | ORAL_TABLET | Freq: Every day | ORAL | Status: DC
  Administered 2017-12-06 – 2017-12-08 (×3): 50 mg via ORAL
  Filled 2017-12-06 (×5): qty 1
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
## 2017-12-06 NOTE — Progress Notes (Signed)
Pt attended orientation/ goals group. During this group Pt discussed their treatment agreement, went over unit rules, and planned goals for the day.    
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
## 2017-12-06 NOTE — Progress Notes (Signed)
Pt has been observed sitting in the dayroom talking with peers most of the evening.  She reports that she was upset with the thought of being in the hospital, but since her arrival, her anxiety has decreased and she is ok with being here.  She denies SI/HI/AVH at this time.  She was encouraged to make her needs known to staff.  She was given a prn Trazodone 50 mg for sleep per request.  Med education was done with pt.  Support and encouragement offered.  Discharge plans are in process.  Pt will return to school after discharge.  Safety maintained with q15 minute checks.

## 2017-12-06 NOTE — Progress Notes (Signed)
Pt presents with a flat affect and anxious mood. Pt reported feeling easily agitated, annoyed and irritable this morning. Pt denies any triggers. Pt requested Vistaril for increased anxiety. Pt provided with Vistaril at her request for relief.  Pt rates depression 6/10. Anxiety 5/10. Pt reports difficulty sleeping last night. Medications administered as ordered per MD. Verbal support provided. 15 minute checks performed for safety. Pt compliant with tx plan.

## 2017-12-06 NOTE — Progress Notes (Signed)
Adult Psychoeducational Group Note  Date:  12/06/2017 Time:  9:40 PM  Group Topic/Focus:  Wrap-Up Group:   The focus of this group is to help patients review their daily goal of treatment and discuss progress on daily workbooks.  Participation Level:  Did Not Attend  Participation Quality:  Did not attend  Affect:  Did not attend  Cognitive:  Did not attend  Insight: None  Engagement in Group:  Did not attend  Modes of Intervention:  Did not attend  Additional Comments:  Patient did not attend wrap up group this evening.  Dezaray Shibuya L Sharalyn Lomba 12/06/2017, 9:40 PM

## 2017-12-06 NOTE — [facility] Suicide Risk Assessment (Signed)
[facility]   Nursing information obtained from:   patient and chart Demographic factors:   22 year old single female  Current Mental Status:   see below Loss Factors:   [place] moved to [place], friend moved away Historical Factors:   chronic depression, alcohol use in binges Risk Reduction Factors:   resilience, physical health  Total Time spent with patient: 45 minutes Principal Problem: MDD  Diagnosis:   Patient Active Problem List   Diagnosis Date Noted  . MDD (major depressive disorder), recurrent severe, without psychosis (HCC) [F33.2] 12/05/2017    Continued Clinical Symptoms:  Alcohol Use Disorder Identification Test Final Score (AUDIT): 17 The "Alcohol Use Disorders Identification Test", Guidelines for Use in Primary Care, Second Edition.  World Science [facility]Health [employer] [facility](WHO). Score between 0-7:  no or low risk or alcohol related problems. Score between 8-15:  moderate risk of alcohol related problems. Score between 16-19:  high risk of alcohol related problems. Score 20 or above:  warrants further diagnostic evaluation for alcohol dependence and treatment.   CLINICAL FACTORS:  22 year old female, college student, reports worsening depression , neuro-vegetative symptoms of depression, passive SI.   Psychiatric Specialty Exam: Physical Exam  ROS  Blood pressure (!) 111/52, pulse 97, temperature 98.1 F (36.7 C), temperature source Oral, resp. rate 18, height 5\' 9"  (1.753 m), weight 62.1 kg (137 lb), unknown if currently breastfeeding.Body mass index is 20.23 kg/m.   see admit note MSE    COGNITIVE FEATURES THAT CONTRIBUTE TO RISK:  Closed-mindedness and Loss of executive function    SUICIDE RISK:   Moderate:  Frequent suicidal ideation with limited intensity, and duration, some specificity in terms of plans, no associated intent, good self-control, limited dysphoria/symptomatology, some risk factors present, and identifiable protective factors,  including available and accessible social support.  PLAN OF CARE: Patient will be admitted to inpatient psychiatric unit for stabilization and safety. Will provide and encourage milieu participation. Provide medication management and maked adjustments as needed.  Will follow daily.    I certify that inpatient services furnished can reasonably be expected to improve the patient's condition.   Craige CottaFernando A Niasia Lanphear, MD 12/06/2017, 9:54 AM

## 2017-12-06 NOTE — [facility] Group Notes (Signed)
LCSW Group Therapy Note 12/06/2017 3:09 PM  Type of Therapy/Topic: Group Therapy: Feelings about Diagnosis  Participation Level: Active   Description of Group:  This group will allow patients to explore their thoughts and feelings about diagnoses they have received. Patients will be guided to explore their level of understanding and acceptance of these diagnoses. Facilitator will encourage patients to process their thoughts and feelings about the reactions of others to their diagnosis and will guide patients in identifying ways to discuss their diagnosis with significant others in their lives. This group will be process-oriented, with patients participating in exploration of their own experiences, giving and receiving support, and processing challenge from other group members.  Therapeutic Goals: 1. Patient will demonstrate understanding of diagnosis as evidenced by identifying two or more symptoms of the disorder 2. Patient will be able to express two feelings regarding the diagnosis 3. Patient will demonstrate their ability to communicate their needs through discussion and/or role play  Summary of Patient Progress:  Patient was engaged throughout the group session. She participated and contributed to the group's discussion regarding feelings about diagnosis, until she was excused by the doctor.     Therapeutic Modalities:  Cognitive Behavioral Therapy Brief Therapy Feelings Identification    Eura Mccauslin Catalina AntiguaWilliams LCSWA Clinical Social Worker

## 2017-12-06 NOTE — [facility] Suicide Risk Assessment (Signed)
[facility] INPATIENT:  Family/Significant Other Suicide Prevention Education  Suicide Prevention Education:  Education Completed; Cyril LoosenMakenzie Jackson, friend, [phone], has been identified by the patient as the family member/significant other with whom the patient will be residing, and identified as the person(s) who will aid the patient in the event of a mental health crisis (suicidal ideations/suicide attempt).  With written consent from the patient, the family member/significant other has been provided the following suicide prevention education, prior to the and/or following the discharge of the patient.  The suicide prevention education provided includes the following:  Suicide risk factors  Suicide prevention and interventions  National Suicide Hotline telephone number  [facility] assessment telephone number  [facility] Emergency Assistance 911  [facility] and/or Residential Mobile Crisis Unit telephone number  Request made of family/significant other to:  Remove weapons (e.g., guns, rifles, knives), all items previously/currently identified as safety concern.  Pt does not have guns, per [facility].  Remove drugs/medications (over-the-counter, prescriptions, illicit drugs), all items previously/currently identified as a safety concern.  The family member/significant other verbalizes understanding of the suicide prevention education information provided.  The family member/significant other agrees to remove the items of safety concern listed above.  Serita KyleMakenzie said she speaks with pt almost every day.  Pt called her drunk and having taken a pill the other day saying she needed to go the hospital.  Serita [place]Makenzie is in [place] and called a mutual friend who got pt to the ED.  Serita KyleMakenzie said pt is stressed out by work and school and she has depression/anxiety as well.  She does think pt has too much on her plate.  She will continue to be in regular contact with pt.  Lorri FrederickWierda,  Dylann Layne Jon, LCSW 12/06/2017, 3:36 PM

## 2017-12-06 NOTE — Progress Notes (Signed)
Pt has been in bed all evening.  Writer went to her room for the shift assessment and she was easily awakened.  She denies SI/HI/AVH at this time.  She voiced no needs or concerns, but stated that she was just tired this evening and wanted to go to bed early.  Pt had no scheduled meds for the evening, but she was reminded that she had a sleep aid if she needed it.  Support and encouragement offered.  Discharge plans are in process.  Pt is an [employer] student and will return to school at discharge.  Safety maintained with q15 minute checks.

## 2017-12-07 DIAGNOSIS — F410 Panic disorder [episodic paroxysmal anxiety] without agoraphobia: Secondary | ICD-10-CM

## 2017-12-07 DIAGNOSIS — Z598 Other problems related to housing and economic circumstances: Secondary | ICD-10-CM

## 2017-12-07 DIAGNOSIS — F419 Anxiety disorder, unspecified: Secondary | ICD-10-CM

## 2017-12-07 DIAGNOSIS — G4700 Insomnia, unspecified: Secondary | ICD-10-CM

## 2017-12-07 LAB — TSH: TSH: 1.458 u[IU]/mL (ref 0.350–4.500)

## 2017-12-07 NOTE — [facility] Group Notes (Signed)
[facility] [employer] Group Therapy 12/07/2017 1:15pm  Type of Therapy: Mental [employer] Presentation  Participation Level: Active  Participation Quality: Attentive  Affect: Appropriate  Cognitive: Oriented  Insight: Developing/Improving  Engagement in Therapy: Engaged  Modes of Intervention: Discussion, Education and Socialization  Summary of Progress/Problems: Mental [employer] Association (MHA) Speaker came to talk about his personal journey with mental health. The pt processed ways by which to relate to the speaker. MHA speaker provided handouts and educational information pertaining to groups and services offered by the MHA. Pt was engaged in speaker's presentation and was receptive to resources provided.    Shermaine Rivet N Smart, LCSW 12/07/2017 11:07 AM  

## 2017-12-07 NOTE — Tx Team (Signed)
Interdisciplinary Treatment and Diagnostic Plan Update  12/07/2017 Time of Session: 1050 Jennifer Walton MRN: [id number]  Principal Diagnosis: <principal problem not specified>  Secondary Diagnoses: Active Problems:   MDD (major depressive disorder), recurrent severe, without psychosis (HCC)   Current Medications:  Current Facility-Administered Medications  Medication Dose Route Frequency Provider Last Rate Last Dose  . acetaminophen (TYLENOL) tablet 650 mg  650 mg Oral Q6H PRN Thermon Leylandavis, Laura A, NP      . alum & mag hydroxide-simeth (MAALOX/MYLANTA) 200-200-20 MG/5ML suspension 30 mL  30 mL Oral Q4H PRN Fransisca Kaufmannavis, Laura A, NP      . hydrOXYzine (ATARAX/VISTARIL) tablet 25 mg  25 mg Oral Q6H PRN Cobos, Rockey SituFernando A, MD   25 mg at 12/07/17 0744  . magnesium hydroxide (MILK OF MAGNESIA) suspension 30 mL  30 mL Oral Daily PRN Fransisca Kaufmannavis, Laura A, NP      . ondansetron [facility](ZOFRAN) tablet 4 mg  4 mg Oral Q8H PRN Fransisca Kaufmannavis, Laura A, NP      . sertraline (ZOLOFT) tablet 50 mg  50 mg Oral Daily Cobos, Rockey SituFernando A, MD   50 mg at 12/07/17 0744  . traZODone (DESYREL) tablet 50 mg  50 mg Oral QHS PRN Thermon Leylandavis, Laura A, NP   50 mg at 12/05/17 2156   PTA Medications: Medications Prior to Admission  Medication Sig Dispense Refill Last Dose  . Etonogestrel (NEXPLANON [place]) Inject into the skin.   cont at Unknown time  . ondansetron (ZOFRAN ODT) 4 MG disintegrating tablet Take 1 tablet (4 mg total) by mouth every 8 (eight) hours as needed for nausea or vomiting. (Patient not taking: Reported on 12/04/2017) 20 tablet 0 Not Taking at Unknown time  . sertraline (ZOLOFT) 25 MG tablet Take 25 mg by mouth daily.   12/03/2017 at Unknown time    Patient Stressors: Educational concerns Marital or family conflict Occupational concerns  Patient Strengths: Ability for insight Average or above average intelligence Capable of independent living [employer] knowledge Motivation for treatment/growth  Treatment Modalities: Medication  Management, Group therapy, Case management,  1 to 1 session with clinician, Psychoeducation, Recreational therapy.   Physician Treatment Plan for Primary Diagnosis: <principal problem not specified> Long Term Goal(s): Improvement in symptoms so as ready for discharge Improvement in symptoms so as ready for discharge   Short Term Goals: Ability to identify changes in lifestyle to reduce recurrence of condition will improve Ability to verbalize feelings will improve Ability to disclose and discuss suicidal ideas Ability to demonstrate self-control will improve Ability to identify triggers associated with substance abuse/mental health issues will improve  Medication Management: Evaluate patient's response, side effects, and tolerance of medication regimen.  Therapeutic Interventions: 1 to 1 sessions, Unit Group sessions and Medication administration.  Evaluation of Outcomes: Progressing  Physician Treatment Plan for Secondary Diagnosis: Active Problems:   MDD (major depressive disorder), recurrent severe, without psychosis (HCC)  Long Term Goal(s): Improvement in symptoms so as ready for discharge Improvement in symptoms so as ready for discharge   Short Term Goals: Ability to identify changes in lifestyle to reduce recurrence of condition will improve Ability to verbalize feelings will improve Ability to disclose and discuss suicidal ideas Ability to demonstrate self-control will improve Ability to identify triggers associated with substance abuse/mental health issues will improve     Medication Management: Evaluate patient's response, side effects, and tolerance of medication regimen.  Therapeutic Interventions: 1 to 1 sessions, Unit Group sessions and Medication administration.  Evaluation of Outcomes: Progressing  RN Treatment Plan for Primary Diagnosis: <principal problem not specified> Long Term Goal(s): Knowledge of disease and therapeutic regimen to maintain health will  improve  Short Term Goals: Ability to identify and develop effective coping behaviors will improve and Compliance with prescribed medications will improve  Medication Management: RN will administer medications as ordered by provider, will assess and evaluate patient's response and provide education to patient for prescribed medication. RN will report any adverse and/or side effects to prescribing provider.  Therapeutic Interventions: 1 on 1 counseling sessions, Psychoeducation, Medication administration, Evaluate responses to treatment, Monitor vital signs and CBGs as ordered, Perform/monitor CIWA, COWS, AIMS and Fall Risk screenings as ordered, Perform wound care treatments as ordered.  Evaluation of Outcomes: Progressing   LCSW Treatment Plan for Primary Diagnosis: <principal problem not specified> Long Term Goal(s): Safe transition to appropriate next level of care at discharge, Engage patient in therapeutic group addressing interpersonal concerns.  Short Term Goals: Engage patient in aftercare planning with referrals and resources, Increase social support and Increase skills for wellness and recovery  Therapeutic Interventions: Assess for all discharge needs, 1 to 1 time with Social worker, Explore available resources and support systems, Assess for adequacy in community support network, Educate family and significant other(s) on suicide prevention, Complete Psychosocial Assessment, Interpersonal group therapy.  Evaluation of Outcomes: Progressing   Progress in Treatment: Attending groups: Yes. Participating in groups: Yes. Taking medication as prescribed: Yes. Toleration medication: Yes. Family/Significant other contact made: Yes, individual(s) contacted:  friend Patient understands diagnosis: Yes. Discussing patient identified problems/goals with staff: Yes. Medical problems stabilized or resolved: Yes. Denies suicidal/homicidal ideation: Yes. Issues/concerns per patient  self-inventory: No. Other: none  New problem(s) identified: No, Describe:  none  New Short Term/Long Term Goal(s):Pt goal: "stop being depressed, stop worrying so much."  Discharge Plan or Barriers:   Reason for Continuation of Hospitalization: Anxiety Depression Medication stabilization  Estimated Length of Stay: 1-3 days. Attendees: Patient:Jennifer Walton 12/07/2017   Physician: Dr Jama Flavors, MD 12/07/2017   Nursing: Liborio Nixon, RN 12/07/2017   RN Care Manager: 12/07/2017   Social Worker: Daleen Squibb, LCSW 12/07/2017   Recreational Therapist:  12/07/2017   Other:  12/07/2017   Other:  12/07/2017   Other: 12/07/2017         Scribe for Treatment Team: Lorri Frederick, LCSW 12/07/2017 9:41 AM

## 2017-12-07 NOTE — Progress Notes (Signed)
Pt presents with an animated affect and anxious mood. Pt reports mood swings of feeling anxious, annoyed and irritated by other people. Pt verbalized to writer that being around other people increases her anxiety and that she would rather be alone. Writer discussed coping skills with pt. Pt verbalized that she will work on walking away from people who annoys or irritates her. Medications reviewed with pt. Medications administered as ordered per MD. Verbal support provided. Pt encouraged to attend groups. 15 minute checks performed for safety.

## 2017-12-07 NOTE — [facility] Group Notes (Signed)
[facility] Group Notes:  (Nursing/MHT/Case Management/Adjunct)  Date:  12/07/2017  Time:  8:57 AM  Type of Therapy:  Orientation/ goals group  Participation Level:  Active  Participation Quality:  Appropriate and Attentive  Affect:  Appropriate  Cognitive:  Alert and Appropriate  Insight:  Appropriate and Good  Engagement in Group:  Engaged  Modes of Intervention:  Discussion and Education  Summary of Progress/Problems:  Pt participated group. During group pt completed her self-inventory. During group, pts wrote down ten positive self-affirmations. Discussion consisted of personal development. Group addressed why it is important to stop doubting ourselves, and how to go about it. Pt shared two of her affirmations during group. Pt stated that she is loyal and she is truthful.   Karren CobbleFizah G Lanitra Battaglini 12/07/2017, 8:57 AM

## 2017-12-07 NOTE — Progress Notes (Addendum)
Recreation Therapy Notes  Date: 12/07/17 Time: 0930 Location: [address] Dayroom  Group Topic: Stress Management  Goal Area(s) Addresses:  Patient will verbalize importance of using healthy stress management.  Patient will identify positive emotions associated with healthy stress management.   Intervention: Stress Management  Activity :  Guided Imagery.  LRT introduced the stress management technique of guided imagery.  LRT read a script that guided patients to envision laying out in the sun on a warm day, enjoying the breeze and sunshine.  Patients were to listen and follow along as script was read.  Education:  Stress Management, Discharge Planning.   Education Outcome: Acknowledges edcuation/In group clarification offered/Needs additional education  Clinical Observations/Feedback: Pt did not attend group.    Caroll RancherMarjette Aissata Wilmore, LRT/CTRS         Caroll RancherLindsay, Zori Benbrook A 12/07/2017 11:24 AM

## 2017-12-07 NOTE — [facility] Group Notes (Signed)
[facility] Group Notes:  (Nursing/MHT/Case Management/Adjunct)  Date:  12/07/2017  Time:  6:41 PM  Type of Therapy:  Psychoeducational Skills  Participation Level:  Active  Summary of Progress/Problems: Pt participated in mindful meditation with the nurse.   Gera Inboden G Ansel Ferrall 12/07/2017, 6:41 PM 

## 2017-12-07 NOTE — Progress Notes (Signed)
Adult Psychoeducational Group Note  Date:  12/07/2017 Time:  1345  Group Topic/Focus:  Relaxation Group  Relaxation activity to help cope with anxiety and depression.   Participation Level:  Active  Participation Quality:  Appropriate  Affect:  Appropriate  Cognitive:  Appropriate  Insight: Appropriate  Engagement in Group:  Engaged  Modes of Intervention:  Activity  Additional Comments:    Finlee Concepcion L   

## 2017-12-07 NOTE — Progress Notes (Signed)
Adult Psychoeducational Group Note  Date:  12/07/2017 Time:  9:57 PM  Group Topic/Focus:  Wrap-Up Group:   The focus of this group is to help patients review their daily goal of treatment and discuss progress on daily workbooks.  Participation Level:  Active  Participation Quality:  Appropriate  Affect:  Appropriate  Cognitive:  Alert  Insight: Appropriate  Engagement in Group:  Engaged  Modes of Intervention:  Discussion  Additional Comments:  Patient stated she was grateful for her friends and family supporting her. Patient's goal for today was to learn to walk away from certain situations and people.   Nidal Rivet L Kathlen Sakurai 12/07/2017, 9:57 PM

## 2017-12-07 NOTE — Progress Notes (Signed)
[facility] MD Progress Note  12/07/2017 1:37 PM Jennifer Walton  MRN:  [id number] Subjective:  Patient is a 22 year old single African American Female who presented to [facility] ED voluntarily on 2/3 due to feeling depressed, with passive suicidal ideations. Please see those notes. Patient states that she had been having worsening depression related to stress at school, at home and her inability to tell people no. Patient states that she felt immense pressure over a long period of time and just wanted things to end. Patient states that before she went to the ED she had taken Xanax x 1 ( not prescribed to her, and states that it was a friend's). Patient consumed alcohol after taking this medication and notes that she blacked out. Patient admits prior drinking binges in addition to this most recent incidence.   Today patient notes that she feels much better than she did previously. Patient specifically denies suicidal ideation, thoughts of hurting herself or others and feels safe at home. Patient is hoping to go home soon. Patient feels like her medication plan is helping her and looks forward to starting counseling after she is able to get discharged from being in-patient. Patient denies any other concerns at this time.  Principal Problem:  Major Depressive Disorder, Recurrent Severe, without Psychosis  Diagnosis:   Patient Active Problem List   Diagnosis Date Noted  . MDD (major depressive disorder), recurrent severe, without psychosis (HCC) [F33.2] 12/05/2017   Total Time spent with patient: 30 minutes  Past Psychiatric History:  Patient denies prior psychiatric admissions, no history of self cutting, states she has had suicidal ideations intermittently in the past, but has never attempted suicide . Denies history of psychosis, denies any clear history of mania [place]. Denies history of violence . Reports history of excessive anxiety ( " I feel I worry too much all the time") and also some panic  attacks,does not endorse agoraphobia.   Past Medical History:  Past Medical History:  Diagnosis Date  . Asthma     Past Surgical History:  Procedure Laterality Date  . KNEE SURGERY     Family History: History reviewed. No pertinent family history.  Social History:  Social History   Substance and Sexual Activity  Alcohol Use Yes   Comment: few times a month     Social History   Substance and Sexual Activity  Drug Use No    Social History   Socioeconomic History  . Marital status: Single    Spouse name: None  . Number of children: None  . Years of education: None  . Highest education level: None  Social Needs  . Financial resource strain: None  . Food insecurity - worry: None  . Food insecurity - inability: None  . Transportation needs - medical: None  . Transportation needs - non-medical: None  Occupational History  . None  Tobacco Use  . Smoking status: Current Every Day Smoker    Packs/day: 0.20    Types: E-cigarettes  . Smokeless tobacco: Never Used  Substance and Sexual Activity  . Alcohol use: Yes    Comment: few times a month  . Drug use: No  . Sexual activity: None  Other Topics Concern  . None  Social History Narrative  . None    Sleep: Good  Appetite:  Good  Current Medications: Current Facility-Administered Medications  Medication Dose Route Frequency Provider Last Rate Last Dose  . acetaminophen (TYLENOL) tablet 650 mg  650 mg Oral Q6H PRN Fransisca Kaufmann  A, NP      . alum & mag hydroxide-simeth (MAALOX/MYLANTA) 200-200-20 MG/5ML suspension 30 mL  30 mL Oral Q4H PRN Fransisca Kaufmann A, NP      . hydrOXYzine (ATARAX/VISTARIL) tablet 25 mg  25 mg Oral Q6H PRN Cobos, Rockey Situ, MD   25 mg at 12/07/17 0744  . magnesium hydroxide (MILK OF MAGNESIA) suspension 30 mL  30 mL Oral Daily PRN Fransisca Kaufmann A, NP      . ondansetron [facility]) tablet 4 mg  4 mg Oral Q8H PRN Fransisca Kaufmann A, NP      . sertraline (ZOLOFT) tablet 50 mg  50 mg Oral Daily Cobos,  Rockey Situ, MD   50 mg at 12/07/17 0744  . traZODone (DESYREL) tablet 50 mg  50 mg Oral QHS PRN Thermon Leyland, NP   50 mg at 12/05/17 2156    Lab Results:  Results for orders placed or performed during the hospital encounter of 12/05/17 (from the past 48 hour(s))  TSH     Status: None   Collection Time: 12/07/17  7:27 AM  Result Value Ref Range   TSH 1.458 0.350 - 4.500 uIU/mL    Comment: Performed by a 3rd Generation assay with a functional sensitivity of <=0.01 uIU/mL. Performed at [facility], [address] [place], [place] [address]     Blood Alcohol level:  Lab Results  Component Value Date   ETH 32 (H) 12/04/2017    Metabolic Disorder Labs: No results found for: HGBA1C, MPG No results found for: PROLACTIN No results found for: CHOL, TRIG, HDL, CHOLHDL, VLDL, LDLCALC  Physical Findings: AIMS: Facial and Oral Movements Muscles of Facial Expression: None, normal Lips and Perioral Area: None, normal Jaw: None, normal Tongue: None, normal,Extremity Movements Upper (arms, wrists, hands, fingers): None, normal Lower (legs, knees, ankles, toes): None, normal, Trunk Movements Neck, shoulders, hips: None, normal, Overall Severity Severity of abnormal movements (highest score from questions above): None, normal Incapacitation due to abnormal movements: None, normal Patient's awareness of abnormal movements (rate only patient's report): No Awareness, Dental Status Current problems with teeth and/or dentures?: No Does patient usually wear dentures?: No  CIWA:    COWS:     Musculoskeletal: Strength & Muscle Tone: within normal limits Gait & Station: normal Patient leans: N/A  Psychiatric Specialty Exam: Physical Exam  Constitutional: She is oriented to person, place, and time. She appears well-developed and well-nourished.  HENT:  Head: Normocephalic.  Neck: Normal range of motion.  Neurological: She is alert and oriented to person, place, and  time. She has normal strength. Coordination and gait normal.  Skin: Skin is warm and dry.  Psychiatric: Her speech is normal and behavior is normal. Judgment and thought content normal. Cognition and memory are normal. She exhibits a depressed mood.    Review of Systems  Psychiatric/Behavioral: Positive for depression and substance abuse.  All other systems reviewed and are negative.   Blood pressure 125/72, pulse 67, temperature 98.4 F (36.9 C), temperature source Oral, resp. rate 16, height 5\' 9"  (1.753 m), weight 62.1 kg (137 lb), unknown if currently breastfeeding.Body mass index is 20.23 kg/m.  General Appearance: Casual  Eye Contact:  Good  Speech:  Clear and Coherent  Volume:  Normal  Mood:  Depression  Affect:  Congruent  Thought Process:  Coherent  Orientation:  Full (Time, Place, and Person)  Thought Content:  Logical  Suicidal Thoughts:  No  Homicidal Thoughts:  No  Memory:  Immediate;  Good Recent;   Good Remote;   Good  Judgement:  Good  Insight:  Fair  Psychomotor Activity:  Normal  Concentration:  Concentration: Good and Attention Span: Good  Recall:  Good  Fund of Knowledge:  Good  Language:  Good  Akathisia:  No  Handed:  Right  AIMS (if indicated):     Assets:  Communication Skills Desire for Improvement Financial Resources/Insurance Housing Physical Health Resilience Social Support  ADL's:  Intact  Cognition:  WNL  Sleep:  Number of Hours: 6.75   Problems Addressed: Major Depressive Disorder, Recurrent Severe, without Psychosis  Treatment Plan Summary: Continue psychiatric medications: Trazodone 50 mg at bedtime PRN sleep Zoloft 50 mg daily for depression  Vistaril 25 mg every six hours PRN anxiety  Continue individual and group therapy  Anticipate discharge tomorrow if social work has been able to help schedule out patient therapy.   Nanine MeansLORD, JAMISON, NP 12/07/2017, 1:37 PM

## 2017-12-08 MED ORDER — TRAZODONE HCL 50 MG PO TABS: 50.0000 mg | ORAL_TABLET | Freq: Every evening | ORAL | 0 refills | Status: DC | PRN

## 2017-12-08 MED ORDER — HYDROXYZINE HCL 25 MG PO TABS: 25.0000 mg | ORAL_TABLET | Freq: Four times a day (QID) | ORAL | 0 refills | Status: DC | PRN

## 2017-12-08 MED ORDER — ONDANSETRON 4 MG PO TBDP: 4.0000 mg | ORAL_TABLET | Freq: Three times a day (TID) | ORAL | 0 refills | Status: DC | PRN

## 2017-12-08 MED ORDER — SERTRALINE HCL 50 MG PO TABS: 50.0000 mg | ORAL_TABLET | Freq: Every day | ORAL | 0 refills | Status: DC

## 2017-12-08 NOTE — Discharge Summary (Signed)
Physician Discharge Summary Note  Patient:  Jennifer Walton is an 22 y.o., female  MRN:  [id number]  DOB:  08-31-1996  Patient phone:  [phone] (home)   Patient address:   [address] [place] [address],   Total Time spent with patient: Greater than 30 minutes  Date of Admission:  12/05/2017  Date of Discharge: 12-08-17  Reason for Admission: Worsening symptoms of depression & suicidal ideations.  Principal Problem: MDD (major depressive disorder), recurrent severe, without psychosis [facility](HCC)  Discharge Diagnoses: Patient Active Problem List   Diagnosis Date Noted  . MDD (major depressive disorder), recurrent severe, without psychosis (HCC) [F33.2] 12/05/2017    Priority: High   Past Psychiatric History: Major depression.  Past Medical History:  Past Medical History:  Diagnosis Date  . Asthma     Past Surgical History:  Procedure Laterality Date  . KNEE SURGERY     Family History: History reviewed. No pertinent family history.  Family Psychiatric  History: See H&P  Social History:  Social History   Substance and Sexual Activity  Alcohol Use Yes   Comment: few times a month     Social History   Substance and Sexual Activity  Drug Use No    Social History   Socioeconomic History  . Marital status: Single    Spouse name: None  . Number of children: None  . Years of education: None  . Highest education level: None  Social Needs  . Financial resource strain: None  . Food insecurity - worry: None  . Food insecurity - inability: None  . Transportation needs - medical: None  . Transportation needs - non-medical: None  Occupational History  . None  Tobacco Use  . Smoking status: Current Every Day Smoker    Packs/day: 0.20    Types: E-cigarettes  . Smokeless tobacco: Never Used  Substance and Sexual Activity  . Alcohol use: Yes    Comment: few times a month  . Drug use: No  . Sexual activity: None  Other Topics Concern  . None  Social History  Narrative  . None   Hospital Course: (per admission assessment): [age] year old single female, college student, presented to ED voluntarily on 2/3 due to feeling depressed, with passive suicidal ideations, " just wanting my suffering to end, not really wanting to kill myself, I don't want my family to suffer".  States " I had been thinking of coming to the hospital for a few days now" because of her worsening depression. Reports that before she went to the ED she had taken Xanax x 1 (not prescribed to her, states it was a friend's) and consumed some  alcohol. States " I guess I blacked out".  Describes drinking in binges, mostly on weekends, states she does not abuse or use Xanax regularly. Patient reports history of chronic depression, but has felt more depressed recently. Attributes worsening depression in part to stress at work, relationship stressors.States BF recently moved to [place] and also has a good friend who moved out of state. Endorses neuro-vegetative symptoms as below.  Jennifer Walton [facility] with complaints of worsening symptoms of depression triggering suicidal thoughts. She cited work & relationship related stressors as the trigger. She was in need of mood stabilization treatments. During the course of her hospitalization, the medication regimen for Jennifer Walton's presenting symptoms were discussed & initiated with her consent. She was medicated & discharged on;  Hydroxyzine 25 mg prn for anxiety, Sertraline 50 mg for  depression & Trazodone 50 mg insomnia. She was enrolled & participated in the group counseling sessions being offered & held on this unit. She was counseled & learned coping skills that should help her cope better & maintain mood stability after discharge. She was resumed on all her pertinent home medications for the other previously existing medical issues presented. She tolerated her treatment regimen without any adverse effects reported.   While her treatment was on  going, Jennifer Walton's improvement was monitored by observation & her daily reports of symptom reduction noted.  Her emotional & mental status were monitored by daily self-inventory reports completed by her & the clinical staff. She was evaluated daily by the treatment team for mood stability & the need for continued recovery after discharge. She was offered further treatment options upon discharge & will follow up with the outpatient psychiatric services as listed below.     Upon discharge, Jennifer Walton was both mentally & medically stable. She is currently denying suicidal, homicidal ideation, auditory, visual/tactile hallucinations, delusional thoughts & or paranoia. She was provided with a 7 days worth, supply samples of her [facility] discharge medications. She left [facility] with all personal belongings in no apparent distress. Transportation per her arrangement.       Physical Findings: AIMS: Facial and Oral Movements Muscles of Facial Expression: None, normal Lips and Perioral Area: None, normal Jaw: None, normal Tongue: None, normal,Extremity Movements Upper (arms, wrists, hands, fingers): None, normal Lower (legs, knees, ankles, toes): None, normal, Trunk Movements Neck, shoulders, hips: None, normal, Overall Severity Severity of abnormal movements (highest score from questions above): None, normal Incapacitation due to abnormal movements: None, normal Patient's awareness of abnormal movements (rate only patient's report): No Awareness, Dental Status Current problems with teeth and/or dentures?: No Does patient usually wear dentures?: No  CIWA:    COWS:     Musculoskeletal: Strength & Muscle Tone: within normal limits Gait & Station: normal Patient leans: N/A  Psychiatric Specialty Exam: Physical Exam  Constitutional: She appears well-developed and well-nourished.  HENT:  Head: Normocephalic.  Eyes: Pupils are equal, round, and reactive to light.  Neck: Normal range of motion.  Cardiovascular: Normal  rate.  Respiratory: Effort normal.  GI: Soft.  Genitourinary:  Genitourinary Comments: Deferred  Musculoskeletal: Normal range of motion.  Neurological: She is alert.  Skin: Skin is warm.    Review of Systems  Constitutional: Negative.   HENT: Negative.   Eyes: Negative.   Respiratory: Negative.   Cardiovascular: Negative.   Gastrointestinal: Negative.   Genitourinary: Negative.   Musculoskeletal: Negative.   Skin: Negative.   Neurological: Negative.   Endo/Heme/Allergies: Negative.   Psychiatric/Behavioral: Positive for depression (Stable) and substance abuse (Hx. alcohol & benzodiazepine use disorder). Negative for hallucinations, memory loss and suicidal ideas. The patient has insomnia (Stable). The patient is not nervous/anxious.     Blood pressure 109/67, pulse 85, temperature 98.2 F (36.8 C), temperature source Oral, resp. rate 16, height 5\' 9"  (1.753 m), weight 62.1 kg (137 lb), unknown if currently breastfeeding.Body mass index is 20.23 kg/m.  See Md's SRA   Have you used any form of tobacco in the last 30 days? (Cigarettes, Smokeless Tobacco, Cigars, and/or Pipes): Yes  Has this patient used any form of tobacco in the last 30 days? (Cigarettes, Smokeless Tobacco, Cigars, and/or Pipes): N/A  Blood Alcohol level:  Lab Results  Component Value Date   ETH 32 (H) 12/04/2017   Metabolic Disorder Labs:  No results found for: HGBA1C, MPG No results found  for: PROLACTIN No results found for: CHOL, TRIG, HDL, CHOLHDL, VLDL, LDLCALC  See Psychiatric Specialty Exam and Suicide Risk Assessment completed by Attending Physician prior to discharge.  Discharge destination:  Home  Is patient on multiple antipsychotic therapies at discharge:  No   Has Patient had three or more failed trials of antipsychotic monotherapy by history:  No  Recommended Plan for Multiple Antipsychotic Therapies: NA  Allergies as of 12/08/2017   No Known Allergies     Medication List    STOP  taking these medications   NEXPLANON [place]     TAKE these medications     Indication  hydrOXYzine 25 MG tablet Commonly known as:  ATARAX/VISTARIL Take 1 tablet (25 mg total) by mouth every 6 (six) hours as needed for anxiety.  Indication:  Feeling Anxious   ondansetron 4 MG disintegrating tablet Commonly known as:  ZOFRAN ODT Take 1 tablet (4 mg total) by mouth every 8 (eight) hours as needed for nausea or vomiting.  Indication:  Nausea and Vomiting   sertraline 50 MG tablet Commonly known as:  ZOLOFT Take 1 tablet (50 mg total) by mouth daily. For depression Start taking on:  12/09/2017 What changed:    medication strength  how much to take  additional instructions  Indication:  Major Depressive Disorder   traZODone 50 MG tablet Commonly known as:  DESYREL Take 1 tablet (50 mg total) by mouth at bedtime as needed for sleep.  Indication:  Trouble Sleeping      Follow-up recommendations: Activity:  As tolerated Diet: As recommended by your primary care doctor. Keep all scheduled follow-up appointments as recommended.  Comments: Patient is instructed prior to discharge to: Take all medications as prescribed by his/her mental healthcare provider. Report any adverse effects and or reactions from the medicines to his/her outpatient provider promptly. Patient has been instructed & cautioned: To not engage in alcohol and or illegal drug use while on prescription medicines. In the event of worsening symptoms, patient is instructed to call the crisis hotline, 911 and or go to the nearest ED for appropriate evaluation and treatment of symptoms. To follow-up with his/her primary care provider for your other medical issues, concerns and or health care needs.   Signed: Armandina Stammer, NP, PMHNP, FNP-BC 12/08/2017, 10:05 AM   Patient seen, Suicide Assessment Completed.  Disposition Plan Reviewed

## 2017-12-08 NOTE — Progress Notes (Signed)
Pt reports she had a good day.  She says the medications seems to be helping her.  She denies SI/HI/AVH.  She knows she needs to surround herself with positive people.  She has been up this evening and in the dayroom interacting with her peers.  She hopes to be discharged soon.  Support and encouragement offered.  Discharge plans are in process.  She will return to her campus apartment at discharge.  Safety maintained with q15 minute checks.

## 2017-12-08 NOTE — Progress Notes (Signed)
  [facility] Adult [facility] Management Discharge Plan :  Will you be returning to the same living situation after discharge:  Yes,  student apartment.  At discharge, do you have transportation home?: Yes  Do you have the ability to pay for your medications: Yes,  BCBS  Release of information consent forms completed and in the chart;  Patient's signature needed at discharge.  Patient to Follow up at: Follow-up Information    [facility].   Why:  Appointment is 12/26/17 at 2:45 [facility].   Contact information: [address] [place] [address] [phone]        [facility] up.   Why:  Counseling Center - 12/12/17 at 11:00am. Please call  reschedule if this time conflicts with your class schedule. Direct number is [phone].  Contact information: [address] [place] [address] [phone]           Next level of care provider has access to [facility] Link:yes  Safety Planning and Suicide Prevention discussed: Yes,  with the patient and her friend.   Have you used any form of tobacco in the last 30 days? (Cigarettes, Smokeless Tobacco, Cigars, and/or Pipes): Yes  Has patient been referred to the Quitline?: N/A patient is not a smoker  Patient has been referred for addiction treatment: N/A  Maeola SarahJolan E Kobe Jansma, LCSWA 12/08/2017, 10:35 AM

## 2017-12-08 NOTE — [facility] Suicide Risk Assessment (Addendum)
[facility] Discharge Suicide Risk Assessment   Principal Problem: depression Discharge Diagnoses:  Patient Active Problem List   Diagnosis Date Noted  . MDD (major depressive disorder), recurrent severe, without psychosis (HCC) [F33.2] 12/05/2017    Total Time spent with patient: 30 minutes  Musculoskeletal: Strength & Muscle Tone: within normal limits Gait & Station: normal Patient leans: N/A  Psychiatric Specialty Exam: ROS denies headache, no chest pain, no shortness of breath, no vomiting   Blood pressure 125/72, pulse 67, temperature 98.4 F (36.9 C), temperature source Oral, resp. rate 16, height 5\' 9"  (1.753 m), weight 62.1 kg (137 lb), unknown if currently breastfeeding.Body mass index is 20.23 kg/m.  General Appearance: Well Groomed  Eye Contact::  Good  Speech:  Normal Rate409  Volume:  Normal  Mood:  reports improved mood and states " I feel my attitude is much better"  Affect:  more reactive   Thought Process:  Linear and Descriptions of Associations: Intact  Orientation:  Full (Time, Place, and Person)  Thought Content:  no hallucinations, no delusions, not internally preoccupied   Suicidal Thoughts:  No denies any suicidal or self injurious ideations, denies homicidal or violent ideations   Homicidal Thoughts:  No  Memory:  recent and remote grossly intact   Judgement:  Other:  improving   Insight:  improving   Psychomotor Activity:  Normal  Concentration:  Good  Recall:  Good  Fund of Knowledge:Good  Language: Good  Akathisia:  Negative  Handed:  Right  AIMS (if indicated):     Assets:  Communication Skills Desire for Improvement Resilience  Sleep:  Number of Hours: 6.5  Cognition: WNL  ADL's:  Intact   Mental Status Per Nursing Assessment::   On Admission:     Demographic Factors:  22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  Loss Factors: BF relocating to [employer], a GF moved out of state   Historical  Factors: No prior history of psychiatric admissions, no prior history of suicide attempts, no history of psychosis.  Risk Reduction Factors:   Sense of responsibility to family, Positive social support and Positive coping skills or problem solving skills  Continued Clinical Symptoms:  At this time patient is alert , attentive, well related, describes mood as improved and denies feeling depressed, affect is reactive, no thought disorder, no suicidal or self injurious ideations, no homicidal or violent ideations, no psychotic symptoms, future oriented. States she plans to spend some time with her family, and return to college Monday. States she is looking forward to being visited by friends. Denies medication side effects, tolerating Zoloft well thus far- we reviewed medication side effects including risk of suicidal ideations early in treatment with antidepressants in young adults  We reviewed negative impact that alcohol/binge drinking can have on mood - patient expresses insight and motivation to avoid alcohol .  Cognitive Features That Contribute To Risk:  No gross cognitive deficits noted upon discharge. Is alert , attentive, and oriented x 3   Suicide Risk:  Mild:  Suicidal ideation of limited frequency, intensity, duration, and specificity.  There are no identifiable plans, no associated intent, mild dysphoria and related symptoms, good self-control (both objective and subjective assessment), few other risk factors, and identifiable protective factors, including available and accessible social support.    Plan Of Care/Follow-up recommendations:  Activity:  as tolerated Diet:  regular Tests:  NA Other:  see below  Patient is reporting feeling better, ready for discharge and  is leaving unit in good spirits. Plans to follow up with Dr. Jannifer FranklinAkintayo for outpatient psychiatric management.  Craige CottaFernando A Cobos, MD 12/08/2017, 9:17 AM

## 2017-12-08 NOTE — Progress Notes (Signed)
Pt received both written and verbal discharge instructions. Pt verbalized understanding of discharge instructions. Pt agreed to f/u appt and med regimen. Pt received d/c packet, prescriptions and belongings from room and locker. Pt safely discharged to the lobby. 

## 2017-12-23 ENCOUNTER — Emergency Department (HOSPITAL_COMMUNITY)
Admission: EM | Admit: 2017-12-23 | Discharge: 2017-12-23 | Disposition: A | Discharge status: Home / Self Care | Payer: BLUE CROSS/BLUE SHIELD | Attending: Emergency Medicine | Admitting: Emergency Medicine

## 2017-12-23 ENCOUNTER — Other Ambulatory Visit: Payer: Self-pay

## 2017-12-23 ENCOUNTER — Emergency Department (HOSPITAL_COMMUNITY): Payer: BLUE CROSS/BLUE SHIELD

## 2017-12-23 ENCOUNTER — Encounter (HOSPITAL_COMMUNITY): Payer: Self-pay | Admitting: *Deleted

## 2017-12-23 DIAGNOSIS — Z79899 Other long term (current) drug therapy: Secondary | ICD-10-CM | POA: Diagnosis not present

## 2017-12-23 DIAGNOSIS — J45909 Unspecified asthma, uncomplicated: Secondary | ICD-10-CM | POA: Diagnosis not present

## 2017-12-23 DIAGNOSIS — R197 Diarrhea, unspecified: Secondary | ICD-10-CM

## 2017-12-23 DIAGNOSIS — R1013 Epigastric pain: Secondary | ICD-10-CM | POA: Diagnosis present

## 2017-12-23 DIAGNOSIS — R112 Nausea with vomiting, unspecified: Secondary | ICD-10-CM | POA: Diagnosis not present

## 2017-12-23 DIAGNOSIS — F17290 Nicotine dependence, other tobacco product, uncomplicated: Secondary | ICD-10-CM | POA: Insufficient documentation

## 2017-12-23 HISTORY — DX: Anxiety disorder, unspecified: F41.9

## 2017-12-23 LAB — CBC WITH DIFFERENTIAL/PLATELET
BASOS PCT: 1 %
Basophils Absolute: 0 10*3/uL (ref 0.0–0.1)
EOS ABS: 0.1 10*3/uL (ref 0.0–0.7)
Eosinophils Relative: 1 %
HCT: 39.3 % (ref 36.0–46.0)
HEMOGLOBIN: 13.6 g/dL (ref 12.0–15.0)
LYMPHS ABS: 3.7 10*3/uL (ref 0.7–4.0)
Lymphocytes Relative: 43 %
MCH: 31.3 pg (ref 26.0–34.0)
MCHC: 34.6 g/dL (ref 30.0–36.0)
MCV: 90.3 fL (ref 78.0–100.0)
MONO ABS: 0.3 10*3/uL (ref 0.1–1.0)
MONOS PCT: 4 %
Neutro Abs: 4.5 10*3/uL (ref 1.7–7.7)
Neutrophils Relative %: 51 %
Platelets: 280 10*3/uL (ref 150–400)
RBC: 4.35 MIL/uL (ref 3.87–5.11)
RDW: 11.7 % (ref 11.5–15.5)
WBC: 8.6 10*3/uL (ref 4.0–10.5)

## 2017-12-23 LAB — URINALYSIS, ROUTINE W REFLEX MICROSCOPIC
BILIRUBIN URINE: NEGATIVE
GLUCOSE, UA: NEGATIVE mg/dL
HGB URINE DIPSTICK: NEGATIVE
Ketones, ur: NEGATIVE mg/dL
Leukocytes, UA: NEGATIVE
Nitrite: NEGATIVE
PROTEIN: NEGATIVE mg/dL
SPECIFIC GRAVITY, URINE: 1.012 (ref 1.005–1.030)
pH: 9 — ABNORMAL HIGH (ref 5.0–8.0)

## 2017-12-23 LAB — COMPREHENSIVE METABOLIC PANEL
ALBUMIN: 4.6 g/dL (ref 3.5–5.0)
ALK PHOS: 37 U/L — AB (ref 38–126)
ALT: 15 U/L (ref 14–54)
AST: 41 U/L (ref 15–41)
Anion gap: 14 (ref 5–15)
BUN: 7 mg/dL (ref 6–20)
CALCIUM: 9.8 mg/dL (ref 8.9–10.3)
CHLORIDE: 106 mmol/L (ref 101–111)
CO2: 19 mmol/L — AB (ref 22–32)
CREATININE: 0.78 mg/dL (ref 0.44–1.00)
GFR calc Af Amer: >60 mL/min (ref >60)
GFR calc non Af Amer: >60 mL/min (ref >60)
GLUCOSE: 117 mg/dL — AB (ref 65–99)
Potassium: 3.3 mmol/L — ABNORMAL LOW (ref 3.5–5.1)
SODIUM: 139 mmol/L (ref 135–145)
Total Bilirubin: 1.8 mg/dL — ABNORMAL HIGH (ref 0.3–1.2)
Total Protein: 7.8 g/dL (ref 6.5–8.1)

## 2017-12-23 LAB — I-STAT BETA HCG BLOOD, ED (MC, WL, AP ONLY)

## 2017-12-23 LAB — LIPASE, BLOOD: LIPASE: 25 U/L (ref 11–51)

## 2017-12-23 MED ORDER — SUCRALFATE 1 G PO TABS
1.0000 g | ORAL_TABLET | Freq: Once | ORAL | Status: AC
  Administered 2017-12-23: 1 g via ORAL
  Filled 2017-12-23: qty 1

## 2017-12-23 MED ORDER — OMEPRAZOLE 20 MG PO CPDR: 20.0000 mg | DELAYED_RELEASE_CAPSULE | Freq: Every day | ORAL | 0 refills | Status: DC

## 2017-12-23 MED ORDER — SODIUM CHLORIDE 0.9 % IV BOLUS (SEPSIS)
1000.0000 mL | Freq: Once | INTRAVENOUS | Status: AC
  Administered 2017-12-23: 1000 mL via INTRAVENOUS

## 2017-12-23 MED ORDER — PROMETHAZINE HCL 25 MG PO TABS: 25.0000 mg | ORAL_TABLET | Freq: Four times a day (QID) | ORAL | 0 refills | Status: DC | PRN

## 2017-12-23 MED ORDER — PROMETHAZINE HCL 25 MG/ML IJ SOLN
25.0000 mg | Freq: Once | INTRAMUSCULAR | Status: AC
  Administered 2017-12-23: 25 mg via INTRAVENOUS
  Filled 2017-12-23: qty 1

## 2017-12-23 MED ORDER — FAMOTIDINE 20 MG IN NS 100 ML IVPB
20.0000 mg | Freq: Once | INTRAVENOUS | Status: AC
  Administered 2017-12-23: 20 mg via INTRAVENOUS
  Filled 2017-12-23: qty 100

## 2017-12-23 MED ORDER — GI COCKTAIL ~~LOC~~
30.0000 mL | Freq: Once | ORAL | Status: AC
  Administered 2017-12-23: 30 mL via ORAL
  Filled 2017-12-23: qty 30

## 2017-12-23 MED ORDER — SUCRALFATE 1 GM/10ML PO SUSP: 1.0000 g | Freq: Three times a day (TID) | ORAL | 0 refills | Status: DC

## 2017-12-23 MED ORDER — ONDANSETRON HCL 4 MG/2ML IJ SOLN: 4.0000 mg | Freq: Once | INTRAMUSCULAR | Status: DC

## 2017-12-23 MED ORDER — FENTANYL CITRATE (PF) 100 MCG/2ML IJ SOLN
25.0000 ug | Freq: Once | INTRAMUSCULAR | Status: AC
Start: 2017-12-23 — End: 2017-12-23
  Administered 2017-12-23: 25 ug via INTRAVENOUS
  Filled 2017-12-23: qty 2

## 2017-12-23 MED ORDER — ONDANSETRON 4 MG PO TBDP
4.0000 mg | ORAL_TABLET | Freq: Once | ORAL | Status: AC
Start: 2017-12-23 — End: 2017-12-23
  Administered 2017-12-23: 4 mg via ORAL
  Filled 2017-12-23: qty 1

## 2017-12-23 NOTE — ED Provider Notes (Signed)
Patient placed in Quick Look pathway, seen and evaluated   Chief Complaint: Nausea, vomiting, diarrhea and abdominal pain  HPI:   The patient states that her symptoms started this morning when she awoke from sleep.  Reports periumbilical pain that does not radiate.  Reports associated nausea, vomiting and diarrhea.  Does report binge drinking last night.  Denies any known sick contacts.  Denies any blood in stool.  Denies any urinary symptoms or fevers.  Has not taking for her symptoms.  ROS: n/v/d, epigastric abdominal pain (one)  Physical Exam:   Gen: No distress  Neuro: Awake and Alert  Skin: Warm    Focused Exam: Patient is in discomfort due to pain, nausea and vomiting.  She is diaphoretic and pale.  Active vomiting noted.  Tenderness to palpation of the epigastric region.  No signs of peritonitis.  Bowel sounds are increased.  Her abdomen is soft.  Heart regular rate and rhythm.  Lungs clear to auscultation bilaterally.  No CVA tenderness.   Initiation of care has begun. The patient has been counseled on the process, plan, and necessity for staying for the completion/evaluation, and the remainder of the medical screening examination  Patient given Zofran, IV fluids for persistent nausea and vomiting.   Discussed with the patient that exiting the department prior to completion of the work-up is AMA and there is no guarantee that there are no emergency medical conditions present.     Rise MuLeaphart, Kenneth T, PA-C 12/23/17 1424    Donnetta Hutchingook, Brian, MD 12/24/17 [phone]

## 2017-12-23 NOTE — ED Notes (Signed)
Pt no longer vomiting.  Sleeping.

## 2017-12-23 NOTE — ED Notes (Signed)
Pt ambulated to Bathroom with little to no assistance

## 2017-12-23 NOTE — Discharge Instructions (Signed)
Your abdominal pain is likely from gastritis, reflux or a stomach ulcer. You will need to take the prescribed proton pump inhibitor as directed, and avoid spicy/fatty/acidic foods. Avoid laying down flat within 30 minutes of eating. Avoid NSAIDs like ibuprofen or Aleve on an empty stomach. Use phenergan as needed for nausea.  This medication make you drowsy so do not drive with it.  Follow up with the gastroenterologist (GI doctor) listed for ongoing evaluation of your abdominal pain. Return to the ER for new or worsening symptoms, any additional concers.   SEEK IMMEDIATE MEDICAL ATTENTION IF YOU DEVELOP ANY OF THE FOLLOWING SYMPTOMS: The pain does not go away or becomes severe.  A temperature above 101 develops.  Repeated vomiting occurs (multiple episodes).  Blood is being passed in stools or vomit (bright red or black tarry stools).  Return also if you develop chest pain, difficulty breathing, dizziness or fainting

## 2017-12-23 NOTE — ED Triage Notes (Signed)
Pt c/o severe peri-umbilical pain, vomiting and diarrhea since this am.  Upon questioning states she did binge drink last night.

## 2017-12-23 NOTE — ED Provider Notes (Signed)
[facility] Provider Note   CSN: [id number] Arrival date & time: 12/23/17  1324     History   Chief Complaint Chief Complaint  Patient presents with  . Abdominal Pain    HPI Jennifer Walton is a 22 y.o. female.  HPI 22 year old female past medical history significant for anxiety and asthma presents to the emergency department today with complaints of epigastric abdominal pain, nausea, vomiting, diarrhea.  Acute onset this morning.  Patient reports been drinking last night.  She denies any bloody stools.  No known sick contacts.  Decreased p.o. intake due to the nausea and vomiting.  Several episodes of nonbloody bilious emesis.  She denies any associated urinary symptoms, fevers, night vaginal symptoms.  She has not taking for symptoms prior to arrival.  Nothing makes better or worse.  No history of same.  No history of abdominal surgeries.  Denies any chronic NSAID use. Past Medical History:  Diagnosis Date  . Anxiety   . Asthma     Patient Active Problem List   Diagnosis Date Noted  . 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 KNEE SURGERY      OB History    Gravida Para Term Preterm AB Living   1             SAB TAB Ectopic Multiple Live Births                   Home Medications    Prior to Admission medications   Medication Sig Start Date End Date Taking? Authorizing Provider  hydrOXYzine (ATARAX/VISTARIL) 25 MG tablet Take 1 tablet (25 mg total) by mouth every 6 (six) hours as needed for anxiety. 12/08/17   Armandina StammerNwoko, Agnes I, NP  ondansetron (ZOFRAN ODT) 4 MG disintegrating tablet Take 1 tablet (4 mg total) by mouth every 8 (eight) hours as needed for nausea or vomiting. 12/08/17   Armandina StammerNwoko, Agnes I, NP  sertraline (ZOLOFT) 50 MG tablet Take 1 tablet (50 mg total) by mouth daily. For depression 12/09/17   Armandina StammerNwoko, Agnes I, NP  traZODone  (DESYREL) 50 MG tablet Take 1 tablet (50 mg total) by mouth at bedtime as needed for sleep. 12/08/17   Sanjuana KavaNwoko, Agnes I, NP    Family History No family history on file.  Social History Social History   Tobacco Use  . Smoking status: Current Every Day Smoker    Packs/day: 0.20    Types: E-cigarettes  . Smokeless tobacco: Never Used  Substance Use Topics  . Alcohol use: Yes    Comment: few times a month  . Drug use: No     Allergies   Patient has no known allergies.   Review of Systems Review of Systems  Constitutional: Negative for chills and fever.  HENT: Negative for congestion.   Eyes: Negative for visual disturbance.  Respiratory: Negative for cough and shortness of breath.   Cardiovascular: Negative for chest pain.  Gastrointestinal: Positive for abdominal pain, diarrhea, nausea and vomiting.  Genitourinary: Negative for dysuria, flank pain, frequency, hematuria, urgency, vaginal bleeding and vaginal discharge.  Musculoskeletal: Negative for arthralgias and myalgias.  Skin: Negative for rash.  Neurological: Negative for dizziness, syncope, weakness, light-headedness, numbness and headaches.  Psychiatric/Behavioral: Negative for sleep disturbance. The patient is not nervous/anxious.      Physical Exam Updated Vital Signs BP (!) 128/59 (BP Location: Right Arm)   Pulse  76   Temp 97.7 F (36.5 C) (Oral)   Resp 14   Ht 5\' 9"  (1.753 m)   Wt 63.5 kg (140 lb)   SpO2 100%   BMI 20.67 kg/m   Physical Exam  Constitutional: She is oriented to person, place, and time. She appears well-developed and well-nourished.  Non-toxic appearance.  She has diaphoretic.  She does appear to be in discomfort in her abdomen with nausea and vomiting noted.  HENT:  Head: Normocephalic and atraumatic.  Nose: Nose normal.  Mouth/Throat: No oropharyngeal exudate.  Dry mucous membranes.  Eyes: Conjunctivae are normal. Pupils are equal, round, and reactive to light. Right eye exhibits no  discharge. Left eye exhibits no discharge.  Neck: Normal range of motion. Neck supple.  Cardiovascular: Normal rate, regular rhythm, normal heart sounds and intact distal pulses. Exam reveals no gallop and no friction rub.  No murmur heard. Pulmonary/Chest: Effort normal and breath sounds normal. No stridor. No respiratory distress. She has no wheezes. She has no rales. She exhibits no tenderness.  Abdominal: Soft. Bowel sounds are normal. There is generalized tenderness and tenderness in the epigastric area. There is no rigidity, no rebound, no guarding, no CVA tenderness, no tenderness at McBurney's point and negative Murphy's sign.  Musculoskeletal: Normal range of motion. She exhibits no tenderness.  Lymphadenopathy:    She has no cervical adenopathy.  Neurological: She is alert and oriented to person, place, and time.  Skin: Skin is warm and dry. Capillary refill takes less than 2 seconds.  Psychiatric: Her behavior is normal. Judgment and thought content normal.  Nursing note and vitals reviewed.    ED Treatments / Results  Labs (all labs ordered are listed, but only abnormal results are displayed) Labs Reviewed  COMPREHENSIVE METABOLIC PANEL - Abnormal; Notable for the following components:      Result Value   Potassium 3.3 (*)    CO2 19 (*)    Glucose, Bld 117 (*)    Alkaline Phosphatase 37 (*)    Total Bilirubin 1.8 (*)    All other components within normal limits  CBC WITH DIFFERENTIAL/PLATELET  LIPASE, BLOOD  URINALYSIS, ROUTINE W REFLEX MICROSCOPIC  I-STAT BETA HCG BLOOD, ED ([facility], [facility], [facility] ONLY)    EKG  EKG Interpretation None       Radiology Dg Abdomen Acute W/chest  Result Date: 12/23/2017 CLINICAL DATA:  Periumbilical pain with vomiting and diarrhea starting this morning. EXAM: DG ABDOMEN ACUTE W/ 1V CHEST COMPARISON:  CT abdomen from 02/16/2017 FINDINGS: Mild S-shaped thoracic scoliosis. Minimal scarring or subsegmental atelectasis at the lung bases. There is  a small amount of formed stool in the colon but otherwise the bowel is gasless. A 2 mm calcification projects over the medial left renal shadow and could possibly be a small calculus. IMPRESSION: 1. The small bowel is gasless. This is nonspecific given that there is no gas in the bowel to form a bowel gas pattern to be analyzed. Although most commonly incidental, abnormality of the bowel cannot be excluded. However, there is formed stool in the right colon. 2. A subtle calcification projects over the left medial kidney and could represent a small renal calculus. 3. Suspected minimal bibasilar subsegmental atelectasis or scarring. 4. Mild thoracic scoliosis with rotary component. Electronically Signed   By: Gaylyn Rong M.D.   On: 12/23/2017 17:41    Procedures Procedures (including critical care time)  Medications Ordered in ED Medications  ondansetron (ZOFRAN-ODT) disintegrating tablet 4 mg (4 mg Oral  Given 12/23/17 1404)  gi cocktail (Maalox,Lidocaine,Donnatal) (30 mLs Oral Given 12/23/17 1404)  sodium chloride 0.9 % bolus 1,000 mL (0 mLs Intravenous Stopped 12/23/17 1625)  promethazine (PHENERGAN) injection 25 mg (25 mg Intravenous Given 12/23/17 1427)  sodium chloride 0.9 % bolus 1,000 mL (0 mLs Intravenous Stopped 12/23/17 1848)  famotidine (PEPCID) IVPB 20 mg in NS 100 mL IVPB (20 mg Intravenous Given 12/23/17 1700)  fentaNYL (SUBLIMAZE) injection 25 mcg (25 mcg Intravenous Given 12/23/17 1624)  sucralfate (CARAFATE) tablet 1 g (1 g Oral Given 12/23/17 1625)     Initial Impression / Assessment and Plan / ED Course  I have reviewed the triage vital signs and the nursing notes.  Pertinent labs & imaging results that were available during my care of the patient were reviewed by me and considered in my medical decision making (see chart for details).     Patient with symptoms consistent with viral gastroenteritis versus alcohol induced gastritis.  Patient was on a binge drinking episode  last night.  Vitals are stable, no fever.  No signs of dehydration, tolerating PO fluids > 6 oz.  Lungs are clear.  No focal abdominal pain, no concern for appendicitis, cholecystitis, pancreatitis, ruptured viscus, UTI, kidney stone, or any other abdominal etiology.  Abdominal x-ray shows no signs of obstructing bowel gas pattern.  Did discuss findings with radiologist.  Given that patient symptoms have improved without any current vomiting no focal tenderness on repeat exam do not feel that patient needs further imaging at this time.  Patient is tolerating p.o. fluids appropriately.  States that she feels significantly improved.  No leukocytosis noted on lab work.  Kidney function is normal.  UA shows no signs of infection.  Mildly decreased.  Potassium was was replaced with IV fluid today.  Encourage patient to increase potassium-containing foods.  Will need a repeat check 1-2 weeks if symptoms not improving.  Repeat abdominal exam shows no focal tenderness.  Supportive therapy indicated with return if symptoms worsen.  Patient counseled.  Pt is hemodynamically stable, in NAD, & able to ambulate in the ED. Evaluation does not show pathology that would require ongoing emergent intervention or inpatient treatment. I explained the diagnosis to the patient. Pain has been managed & has no complaints prior to dc. Pt is comfortable with above plan and is stable for discharge at this time. All questions were answered prior to disposition. Strict return precautions for f/u to the ED were discussed. Encouraged follow up with PCP.    Final Clinical Impressions(s) / ED Diagnoses   Final diagnoses:  Epigastric pain  Nausea vomiting and diarrhea    ED Discharge Orders        Ordered    promethazine (PHENERGAN) 25 MG tablet  Every 6 hours PRN     12/23/17 2011    sucralfate (CARAFATE) 1 GM/10ML suspension  3 times daily with meals & bedtime     12/23/17 2011    omeprazole (PRILOSEC) 20 MG capsule  Daily      12/23/17 2011       Wallace Keller 12/23/17 2017    Tegeler, Canary Brim, MD 12/24/17 [phone]

## 2017-12-23 NOTE — ED Notes (Signed)
Pt tolerating po fluid

## 2018-03-25 ENCOUNTER — Telehealth (HOSPITAL_COMMUNITY): Payer: Self-pay

## 2018-03-25 NOTE — Telephone Encounter (Signed)
Letter was sent to patient regarding results. Letter was returned as a return to sender.

## 2018-04-29 ENCOUNTER — Ambulatory Visit: Payer: Self-pay | Admitting: Urgent Care

## 2018-10-08 ENCOUNTER — Ambulatory Visit (HOSPITAL_COMMUNITY)
Admission: EM | Admit: 2018-10-08 | Discharge: 2018-10-08 | Disposition: A | Discharge status: Home / Self Care | Payer: BLUE CROSS/BLUE SHIELD | Attending: Emergency Medicine | Admitting: Emergency Medicine

## 2018-10-08 ENCOUNTER — Encounter (HOSPITAL_COMMUNITY): Payer: Self-pay

## 2018-10-08 ENCOUNTER — Other Ambulatory Visit: Payer: Self-pay

## 2018-10-08 DIAGNOSIS — F17200 Nicotine dependence, unspecified, uncomplicated: Secondary | ICD-10-CM | POA: Diagnosis not present

## 2018-10-08 DIAGNOSIS — N898 Other specified noninflammatory disorders of vagina: Secondary | ICD-10-CM | POA: Insufficient documentation

## 2018-10-08 DIAGNOSIS — Z202 Contact with and (suspected) exposure to infections with a predominantly sexual mode of transmission: Secondary | ICD-10-CM | POA: Diagnosis not present

## 2018-10-08 DIAGNOSIS — Z8619 Personal history of other infectious and parasitic diseases: Secondary | ICD-10-CM | POA: Diagnosis not present

## 2018-10-08 DIAGNOSIS — F329 Major depressive disorder, single episode, unspecified: Secondary | ICD-10-CM | POA: Diagnosis not present

## 2018-10-08 DIAGNOSIS — Z7251 High risk heterosexual behavior: Secondary | ICD-10-CM | POA: Diagnosis not present

## 2018-10-08 DIAGNOSIS — J45909 Unspecified asthma, uncomplicated: Secondary | ICD-10-CM | POA: Insufficient documentation

## 2018-10-08 DIAGNOSIS — F419 Anxiety disorder, unspecified: Secondary | ICD-10-CM | POA: Diagnosis not present

## 2018-10-08 MED ORDER — CEFTRIAXONE SODIUM 250 MG IJ SOLR
INTRAMUSCULAR | Status: AC
  Filled 2018-10-08: qty 250

## 2018-10-08 MED ORDER — AZITHROMYCIN 250 MG PO TABS
1000.0000 mg | ORAL_TABLET | Freq: Once | ORAL | Status: AC
  Administered 2018-10-08: 1000 mg via ORAL

## 2018-10-08 MED ORDER — CEFTRIAXONE SODIUM 250 MG IJ SOLR
250.0000 mg | Freq: Once | INTRAMUSCULAR | Status: AC
  Administered 2018-10-08: 250 mg via INTRAMUSCULAR

## 2018-10-08 MED ORDER — AZITHROMYCIN 250 MG PO TABS
ORAL_TABLET | ORAL | Status: AC
  Filled 2018-10-08: qty 4

## 2018-10-08 MED ORDER — LIDOCAINE HCL (PF) 1 % IJ SOLN
INTRAMUSCULAR | Status: AC
  Filled 2018-10-08: qty 2

## 2018-10-08 NOTE — ED Triage Notes (Signed)
Pt state she has 2 partners and she would like to be tested for STD's.

## 2018-10-08 NOTE — ED Provider Notes (Signed)
[facility]    CSN: [id number] Arrival date & time: 10/08/18  1610     History   Chief Complaint Chief Complaint  Patient presents with  . SEXUALLY TRANSMITTED DISEASE    HPI Jennifer Loserron Jacquet is a 22 y.o. female.   HPI  Jennifer Walton is a 22 y.o. female presenting to UC with c/o vaginal discharge and irritation for about 2-3 days.  She has 2 sexual partners and one tested positive for chlamydia recently.  Hx of chlamydia in the past. She does not use condoms. Denies fever, chills, n/v/d. Denies abdominal pain or urinary symptoms. Pt requesting to be treated today as she was tx for STDs empirically in the past at this UC. LMP: 08/31/18, normal for pt to have abnormal cycle to due birth control   Past Medical History:  Diagnosis Date  . Anxiety   . Asthma     Patient Active Problem List   Diagnosis Date Noted  . 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 KNEE SURGERY      OB History    Gravida  1   Para      Term      Preterm      AB      Living        SAB      TAB      Ectopic      Multiple      Live Births               Home Medications    Prior to Admission medications   Medication Sig Start Date End Date Taking? Authorizing Provider  hydrOXYzine (ATARAX/VISTARIL) 25 MG tablet Take 1 tablet (25 mg total) by mouth every 6 (six) hours as needed for anxiety. 12/08/17   Armandina StammerNwoko, Agnes I, NP  omeprazole (PRILOSEC) 20 MG capsule Take 1 capsule (20 mg total) by mouth daily. 12/23/17   Demetrios LollLeaphart, Kenneth T, PA-C  ondansetron (ZOFRAN ODT) 4 MG disintegrating tablet Take 1 tablet (4 mg total) by mouth every 8 (eight) hours as needed for nausea or vomiting. 12/08/17   Armandina StammerNwoko, Agnes I, NP  promethazine (PHENERGAN) 25 MG tablet Take 1 tablet (25 mg total) by mouth every 6 (six) hours as needed for nausea or vomiting. 12/23/17   Demetrios LollLeaphart, Kenneth T, PA-C    sertraline (ZOLOFT) 50 MG tablet Take 1 tablet (50 mg total) by mouth daily. For depression 12/09/17   Armandina StammerNwoko, Agnes I, NP  sucralfate (CARAFATE) 1 GM/10ML suspension Take 10 mLs (1 g total) by mouth 4 (four) times daily -  with meals and at bedtime. 12/23/17   Rise MuLeaphart, Kenneth T, PA-C  traZODone (DESYREL) 50 MG tablet Take 1 tablet (50 mg total) by mouth at bedtime as needed for sleep. 12/08/17   Sanjuana KavaNwoko, Agnes I, NP    Family History History reviewed. No pertinent family history.  Social History Social History   Tobacco Use  . Smoking status: Current Every Day Smoker    Packs/day: 0.20    Types: E-cigarettes  . Smokeless tobacco: Never Used  Substance Use Topics  . Alcohol use: Yes    Comment: few times a month  . Drug use: No     Allergies   Patient has no known allergies.   Review of Systems Review of Systems  Constitutional: Negative for chills and fever.  Genitourinary: Positive for vaginal discharge and vaginal pain.  Negative for dysuria, flank pain, frequency, pelvic pain and urgency.     Physical Exam Triage Vital Signs ED Triage Vitals  Enc Vitals Group     BP 10/08/18 1715 124/64     Pulse Rate 10/08/18 1715 70     Resp 10/08/18 1715 18     Temp 10/08/18 1715 98.5 F (36.9 C)     Temp src --      SpO2 10/08/18 1715 99 %     Weight 10/08/18 1713 136 lb (61.7 kg)     Height --      Head Circumference --      Peak Flow --      Pain Score 10/08/18 1713 3     Pain Loc --      Pain Edu? --      Excl. in GC? --    No data found.  Updated Vital Signs BP 124/64 (BP Location: Right Arm)   Pulse 70   Temp 98.5 F (36.9 C)   Resp 18   Wt 136 lb (61.7 kg)   SpO2 99%   BMI 20.08 kg/m   Visual Acuity Right Eye Distance:   Left Eye Distance:   Bilateral Distance:    Right Eye Near:   Left Eye Near:    Bilateral Near:     Physical Exam  Constitutional: She is oriented to person, place, and time. She appears well-developed and well-nourished.  HENT:   Head: Normocephalic and atraumatic.  Eyes: EOM are normal.  Neck: Normal range of motion.  Cardiovascular: Normal rate.  Pulmonary/Chest: Effort normal.  Genitourinary:  Genitourinary Comments: deferred  Musculoskeletal: Normal range of motion.  Neurological: She is alert and oriented to person, place, and time.  Skin: Skin is warm and dry.  Psychiatric: She has a normal mood and affect. Her behavior is normal.  Nursing note and vitals reviewed.    UC Treatments / Results  Labs (all labs ordered are listed, but only abnormal results are displayed) Labs Reviewed  CERVICOVAGINAL ANCILLARY ONLY    EKG None  Radiology No results found.  Procedures Procedures (including critical care time)  Medications Ordered in UC Medications  cefTRIAXone (ROCEPHIN) injection 250 mg (has no administration in time range)  azithromycin (ZITHROMAX) tablet 1,000 mg (has no administration in time range)    Initial Impression / Assessment and Plan / UC Course  I have reviewed the triage vital signs and the nursing notes.  Pertinent labs & imaging results that were available during my care of the patient were reviewed by me and considered in my medical decision making (see chart for details).     Self swab provided for wet prep and GC/chlamydia testing Pt declined testing for HIV or syphilis.  Empiric tx with rocephin and azithromycin provided  Final Clinical Impressions(s) / UC Diagnoses   Final diagnoses:  Exposure to chlamydia  Vaginal discharge     Discharge Instructions      Refrain from sexual intercourse for at least 7 days. Be sure to have all partners tested and treated for STDs.  Practice safe sex by always wearing condoms.   Please follow up with family medicine or women's clinic for ongoing healthcare needs.      ED Prescriptions    None     Controlled Substance Prescriptions [place] Controlled Substance Registry consulted? Not Applicable   Rolla Plate 10/08/18 1747

## 2018-10-08 NOTE — Discharge Instructions (Signed)
°  Refrain from sexual intercourse for at least 7 days. Be sure to have all partners tested and treated for STDs.  Practice safe sex by always wearing condoms.   Please follow up with family medicine or women's clinic for ongoing healthcare needs.

## 2018-10-09 ENCOUNTER — Telehealth (HOSPITAL_COMMUNITY): Payer: Self-pay | Admitting: Emergency Medicine

## 2018-10-09 LAB — CERVICOVAGINAL ANCILLARY ONLY
Bacterial vaginitis: POSITIVE — AB
Candida vaginitis: NEGATIVE
Chlamydia: NEGATIVE
Neisseria Gonorrhea: NEGATIVE
Trichomonas: NEGATIVE

## 2018-10-09 MED ORDER — METRONIDAZOLE 500 MG PO TABS: 500.0000 mg | ORAL_TABLET | Freq: Two times a day (BID) | ORAL | 0 refills | Status: DC

## 2018-10-09 NOTE — Telephone Encounter (Signed)
Bacterial vaginosis is positive. This was not treated at the urgent care visit.  Flagyl 500 mg BID x 7 days #14 no refills sent to patients pharmacy of choice.    Pt contacted about results. All questions answered.

## 2018-10-15 ENCOUNTER — Encounter (HOSPITAL_COMMUNITY): Payer: Self-pay

## 2018-10-15 ENCOUNTER — Emergency Department (HOSPITAL_COMMUNITY)
Admission: EM | Admit: 2018-10-15 | Discharge: 2018-10-15 | Disposition: A | Discharge status: Home / Self Care | Payer: BLUE CROSS/BLUE SHIELD | Attending: Emergency Medicine | Admitting: Emergency Medicine

## 2018-10-15 ENCOUNTER — Other Ambulatory Visit: Payer: Self-pay

## 2018-10-15 DIAGNOSIS — J45909 Unspecified asthma, uncomplicated: Secondary | ICD-10-CM | POA: Diagnosis not present

## 2018-10-15 DIAGNOSIS — F17290 Nicotine dependence, other tobacco product, uncomplicated: Secondary | ICD-10-CM | POA: Diagnosis not present

## 2018-10-15 DIAGNOSIS — Z79899 Other long term (current) drug therapy: Secondary | ICD-10-CM | POA: Insufficient documentation

## 2018-10-15 DIAGNOSIS — A084 Viral intestinal infection, unspecified: Secondary | ICD-10-CM

## 2018-10-15 DIAGNOSIS — R112 Nausea with vomiting, unspecified: Secondary | ICD-10-CM | POA: Diagnosis present

## 2018-10-15 LAB — COMPREHENSIVE METABOLIC PANEL
ALK PHOS: 33 U/L — AB (ref 38–126)
ALT: 14 U/L (ref 0–44)
AST: 23 U/L (ref 15–41)
Albumin: 4.4 g/dL (ref 3.5–5.0)
Anion gap: 9 (ref 5–15)
BUN: 10 mg/dL (ref 6–20)
CO2: 24 mmol/L (ref 22–32)
Calcium: 9.3 mg/dL (ref 8.9–10.3)
Chloride: 107 mmol/L (ref 98–111)
Creatinine, Ser: 0.73 mg/dL (ref 0.44–1.00)
GFR calc Af Amer: >60 mL/min (ref >60)
GFR calc non Af Amer: >60 mL/min (ref >60)
GLUCOSE: 94 mg/dL (ref 70–99)
Potassium: 4.1 mmol/L (ref 3.5–5.1)
Sodium: 140 mmol/L (ref 135–145)
Total Bilirubin: 1.5 mg/dL — ABNORMAL HIGH (ref 0.3–1.2)
Total Protein: 7.6 g/dL (ref 6.5–8.1)

## 2018-10-15 LAB — CBC
HCT: 39.1 % (ref 36.0–46.0)
Hemoglobin: 12.8 g/dL (ref 12.0–15.0)
MCH: 31.2 pg (ref 26.0–34.0)
MCHC: 32.7 g/dL (ref 30.0–36.0)
MCV: 95.4 fL (ref 80.0–100.0)
Platelets: 264 10*3/uL (ref 150–400)
RBC: 4.1 MIL/uL (ref 3.87–5.11)
RDW: 12.5 % (ref 11.5–15.5)
WBC: 7.9 10*3/uL (ref 4.0–10.5)
nRBC: 0 % (ref 0.0–0.2)

## 2018-10-15 LAB — I-STAT BETA HCG BLOOD, ED (MC, WL, AP ONLY): I-stat hCG, quantitative: <5 m[IU]/mL (ref <5)

## 2018-10-15 LAB — URINALYSIS, ROUTINE W REFLEX MICROSCOPIC
Bacteria, UA: NONE SEEN
Bilirubin Urine: NEGATIVE
Glucose, UA: NEGATIVE mg/dL
Hgb urine dipstick: NEGATIVE
Ketones, ur: NEGATIVE mg/dL
Nitrite: NEGATIVE
Protein, ur: 100 mg/dL — AB
Specific Gravity, Urine: 1.024 (ref 1.005–1.030)
pH: 9 — ABNORMAL HIGH (ref 5.0–8.0)

## 2018-10-15 LAB — LIPASE, BLOOD: Lipase: 25 U/L (ref 11–51)

## 2018-10-15 MED ORDER — SODIUM CHLORIDE 0.9 % IV BOLUS (SEPSIS)
1000.0000 mL | Freq: Once | INTRAVENOUS | Status: AC
  Administered 2018-10-15: 1000 mL via INTRAVENOUS

## 2018-10-15 MED ORDER — SODIUM CHLORIDE 0.9 % IV SOLN
1000.0000 mL | INTRAVENOUS | Status: DC
  Administered 2018-10-15: 1000 mL via INTRAVENOUS

## 2018-10-15 NOTE — ED Triage Notes (Addendum)
Pt reports N/V since noon today. She reports that she took Zofran then called EMS and now her nausea has improved some. Pt also reports a runny nose and diffuse abdominal pain. No vomiting with EMS. She states that she is a vegetarian and she ate fish tacos last night and hasnt eaten meat in a long time.

## 2018-10-15 NOTE — Discharge Instructions (Addendum)
Take Imodium as needed for diarrhea.  Take your Zofran that you have as needed for nausea and vomiting.  Return to the emergency room for worsening symptoms

## 2018-10-15 NOTE — ED Notes (Signed)
Urine culture sent down to lab with urinalysis. 

## 2018-10-15 NOTE — ED Provider Notes (Signed)
[facility]   CSN: [id number] Arrival date & time: 10/15/18  1804     History   Chief Complaint Chief Complaint  Patient presents with  . Nausea    HPI Jennifer Walton is a 22 y.o. female.  HPI Patient presents emergency room with complaints of nausea, vomiting, and diarrhea.  Patient states she had some fish tacos last night it was unusual for her because she is generally vegetarian.  Today she started having episodes of vomiting and diarrhea.  Patient had multiple episodes, may be at least 7 episodes of each over the last 6 hours.  Patient denies having any abdominal pain.  She did have some chills.  Patient states she took a Zofran prior to coming to the emergency room.  She is actually starting to feel somewhat better now Past Medical History:  Diagnosis Date  . Anxiety   . Asthma     Patient Active Problem List   Diagnosis Date Noted  . 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 KNEE SURGERY       OB History    Gravida  1   Para      Term      Preterm      AB      Living        SAB      TAB      Ectopic      Multiple      Live Births               Home Medications    Prior to Admission medications   Medication Sig Start Date End Date Taking? Authorizing Provider  hydrOXYzine (ATARAX/VISTARIL) 25 MG tablet Take 1 tablet (25 mg total) by mouth every 6 (six) hours as needed for anxiety. 12/08/17   Armandina StammerNwoko, Agnes I, NP  metroNIDAZOLE (FLAGYL) 500 MG tablet Take 1 tablet (500 mg total) by mouth 2 (two) times daily. 10/09/18   Eustace MooreNelson, Yvonne Sue, MD  omeprazole (PRILOSEC) 20 MG capsule Take 1 capsule (20 mg total) by mouth daily. 12/23/17   Demetrios LollLeaphart, Kenneth T, PA-C  ondansetron (ZOFRAN ODT) 4 MG disintegrating tablet Take 1 tablet (4 mg total) by mouth every 8 (eight) hours as needed for nausea or vomiting.  12/08/17   Armandina StammerNwoko, Agnes I, NP  promethazine (PHENERGAN) 25 MG tablet Take 1 tablet (25 mg total) by mouth every 6 (six) hours as needed for nausea or vomiting. 12/23/17   Demetrios LollLeaphart, Kenneth T, PA-C  sertraline (ZOLOFT) 50 MG tablet Take 1 tablet (50 mg total) by mouth daily. For depression 12/09/17   Armandina StammerNwoko, Agnes I, NP  sucralfate (CARAFATE) 1 GM/10ML suspension Take 10 mLs (1 g total) by mouth 4 (four) times daily -  with meals and at bedtime. 12/23/17   Rise MuLeaphart, Kenneth T, PA-C  traZODone (DESYREL) 50 MG tablet Take 1 tablet (50 mg total) by mouth at bedtime as needed for sleep. 12/08/17   Sanjuana KavaNwoko, Agnes I, NP    Family History History reviewed. No pertinent family history.  Social History Social History   Tobacco Use  . Smoking status: Current Every Day Smoker    Packs/day: 0.20    Types: E-cigarettes  . Smokeless tobacco: Never Used  Substance Use Topics  . Alcohol use: Yes    Comment: few times a month  . Drug use: No     Allergies  Patient has no known allergies.   Review of Systems Review of Systems  All other systems reviewed and are negative.    Physical Exam Updated Vital Signs BP 136/84 (BP Location: Right Arm)   Pulse (!) 56   Temp 98 F (36.7 C) (Oral)   Resp 16   SpO2 100%   Physical Exam Vitals signs and nursing note reviewed.  Constitutional:      General: She is not in acute distress.    Appearance: She is well-developed.  HENT:     Head: Normocephalic and atraumatic.     Right Ear: External ear normal.     Left Ear: External ear normal.  Eyes:     General: No scleral icterus.       Right eye: No discharge.        Left eye: No discharge.     Conjunctiva/sclera: Conjunctivae normal.  Neck:     Musculoskeletal: Neck supple.     Trachea: No tracheal deviation.  Cardiovascular:     Rate and Rhythm: Normal rate and regular rhythm.  Pulmonary:     Effort: Pulmonary effort is normal. No respiratory distress.     Breath sounds: Normal breath sounds. No  stridor. No wheezing or rales.  Abdominal:     General: Bowel sounds are normal. There is no distension.     Palpations: Abdomen is soft.     Tenderness: There is no abdominal tenderness. There is no guarding or rebound.  Musculoskeletal:        General: No tenderness.  Skin:    General: Skin is warm and dry.     Findings: No rash.  Neurological:     Mental Status: She is alert.     Cranial Nerves: No cranial nerve deficit (no facial droop, extraocular movements intact, no slurred speech).     Sensory: No sensory deficit.     Motor: No abnormal muscle tone or seizure activity.     Coordination: Coordination normal.      ED Treatments / Results  Labs (all labs ordered are listed, but only abnormal results are displayed) Labs Reviewed  COMPREHENSIVE METABOLIC PANEL - Abnormal; Notable for the following components:      Result Value   Alkaline Phosphatase 33 (*)    Total Bilirubin 1.5 (*)    All other components within normal limits  URINALYSIS, ROUTINE W REFLEX MICROSCOPIC - Abnormal; Notable for the following components:   APPearance CLOUDY (*)    pH 9.0 (*)    Protein, ur 100 (*)    Leukocytes, UA TRACE (*)    All other components within normal limits  LIPASE, BLOOD  CBC  I-STAT BETA HCG BLOOD, ED ([facility], [facility], [facility] ONLY)    EKG None  Radiology No results found.  Procedures Procedures (including critical care time)  Medications Ordered in ED Medications  sodium chloride 0.9 % bolus 1,000 mL (1,000 mLs Intravenous New Bag/Given 10/15/18 2157)    Followed by  0.9 %  sodium chloride infusion (1,000 mLs Intravenous New Bag/Given 10/15/18 2157)     Initial Impression / Assessment and Plan / ED Course  I have reviewed the triage vital signs and the nursing notes.  Pertinent labs & imaging results that were available during my care of the patient were reviewed by me and considered in my medical decision making (see chart for details).   Patient presented to the  emergency room for evaluation of nausea vomiting and diarrhea.  Patient symptoms improved after IV  fluids and Zofran.  Laboratory tests are reassuring.  Abdominal exam is benign.  I doubt appendicitis, cholecystitis, pancreatitis or other emergent etiology.  Symptoms most likely related to viral gastroenteritis.  Final Clinical Impressions(s) / ED Diagnoses   Final diagnoses:  Viral gastroenteritis    ED Discharge Orders    None       Linwood Dibbles, MD 10/15/18 2227

## 2018-10-24 ENCOUNTER — Other Ambulatory Visit: Payer: Self-pay

## 2018-10-24 ENCOUNTER — Emergency Department (HOSPITAL_COMMUNITY)
Admission: EM | Admit: 2018-10-24 | Discharge: 2018-10-24 | Disposition: A | Discharge status: Home / Self Care | Payer: BLUE CROSS/BLUE SHIELD | Attending: Emergency Medicine | Admitting: Emergency Medicine

## 2018-10-24 ENCOUNTER — Encounter (HOSPITAL_COMMUNITY): Payer: Self-pay | Admitting: Emergency Medicine

## 2018-10-24 DIAGNOSIS — Z79899 Other long term (current) drug therapy: Secondary | ICD-10-CM | POA: Diagnosis not present

## 2018-10-24 DIAGNOSIS — J45909 Unspecified asthma, uncomplicated: Secondary | ICD-10-CM | POA: Insufficient documentation

## 2018-10-24 DIAGNOSIS — F1110 Opioid abuse, uncomplicated: Secondary | ICD-10-CM

## 2018-10-24 DIAGNOSIS — F17290 Nicotine dependence, other tobacco product, uncomplicated: Secondary | ICD-10-CM | POA: Diagnosis not present

## 2018-10-24 LAB — RAPID URINE DRUG SCREEN, HOSP PERFORMED
Amphetamines: NOT DETECTED
Barbiturates: NOT DETECTED
Benzodiazepines: POSITIVE — AB
Cocaine: POSITIVE — AB
Opiates: POSITIVE — AB
Tetrahydrocannabinol: NOT DETECTED

## 2018-10-24 LAB — URINALYSIS, ROUTINE W REFLEX MICROSCOPIC
Bilirubin Urine: NEGATIVE
Glucose, UA: NEGATIVE mg/dL
Hgb urine dipstick: NEGATIVE
Ketones, ur: NEGATIVE mg/dL
Leukocytes, UA: NEGATIVE
Nitrite: NEGATIVE
Protein, ur: NEGATIVE mg/dL
Specific Gravity, Urine: 1.006 (ref 1.005–1.030)
pH: 7 (ref 5.0–8.0)

## 2018-10-24 LAB — PREGNANCY, URINE: Preg Test, Ur: NEGATIVE

## 2018-10-24 MED ORDER — NALOXONE HCL 4 MG/0.1ML NA LIQD: 1.0000 | Freq: Once | NASAL | 2 refills | Status: AC

## 2018-10-24 NOTE — ED Notes (Signed)
PT requesting to have IV out and discharge. RN made aware

## 2018-10-24 NOTE — ED Provider Notes (Signed)
[facility]-EMERGENCY DEPT Provider Note   CSN: [id number] Arrival date & time: 10/24/18  [id number]     History   Chief Complaint Chief Complaint  Patient presents with  . Alcohol Intoxication  . Addiction Problem    HPI Jennifer Walton is a 22 y.o. female.  The history is provided by the patient.  Drug Overdose  This is a new problem. The current episode started 3 to 5 hours ago. The problem occurs rarely. The problem has been resolved. Pertinent negatives include no chest pain, no abdominal pain, no headaches and no shortness of breath. Nothing aggravates the symptoms. Nothing relieves the symptoms. Treatments tried: narcan. The treatment provided significant relief.    Past Medical History:  Diagnosis Date  . Anxiety   . Asthma     Patient Active Problem List   Diagnosis Date Noted  . 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 KNEE SURGERY       OB History    Gravida  1   Para      Term      Preterm      AB      Living        SAB      TAB      Ectopic      Multiple      Live Births               Home Medications    Prior to Admission medications   Medication Sig Start Date End Date Taking? Authorizing Provider  ibuprofen (ADVIL,MOTRIN) 200 MG tablet Take 400 mg by mouth every 6 (six) hours as needed for mild pain.   Yes [provider]  ondansetron (ZOFRAN) 4 MG tablet Take 4 mg by mouth every 8 (eight) hours as needed for nausea or vomiting.   Yes [provider]  sertraline (ZOLOFT) 50 MG tablet Take 1 tablet (50 mg total) by mouth daily. For depression 12/09/17  Yes Armandina StammerNwoko, Agnes I, NP  traZODone (DESYREL) 50 MG tablet Take 1 tablet (50 mg total) by mouth at bedtime as needed for sleep. 12/08/17  Yes Armandina StammerNwoko, Agnes I, NP  hydrOXYzine (ATARAX/VISTARIL) 25 MG tablet Take 1 tablet (25 mg total) by mouth every 6 (six)  hours as needed for anxiety. Patient not taking: Reported on 10/24/2018 12/08/17   Armandina StammerNwoko, Agnes I, NP  metroNIDAZOLE (FLAGYL) 500 MG tablet Take 1 tablet (500 mg total) by mouth 2 (two) times daily. Patient not taking: Reported on 10/24/2018 10/09/18   Eustace MooreNelson, Yvonne Sue, MD  naloxone [facility](NARCAN) nasal spray 4 mg/0.1 mL Place 1 spray into the nose once for 1 dose. 10/24/18 10/24/18  Toryn Dewalt, DO  omeprazole (PRILOSEC) 20 MG capsule Take 1 capsule (20 mg total) by mouth daily. Patient not taking: Reported on 10/24/2018 12/23/17   Demetrios LollLeaphart, Kenneth T, PA-C  ondansetron (ZOFRAN ODT) 4 MG disintegrating tablet Take 1 tablet (4 mg total) by mouth every 8 (eight) hours as needed for nausea or vomiting. Patient not taking: Reported on 10/24/2018 12/08/17   Armandina StammerNwoko, Agnes I, NP  promethazine (PHENERGAN) 25 MG tablet Take 1 tablet (25 mg total) by mouth every 6 (six) hours as needed for nausea or vomiting. Patient not taking: Reported on 10/24/2018 12/23/17   Demetrios LollLeaphart, Kenneth T, PA-C  sucralfate (CARAFATE) 1 GM/10ML suspension Take 10 mLs (1 g total) by mouth 4 (four) times daily -  with meals and at bedtime. Patient not taking: Reported on 10/24/2018 12/23/17   Rise Mu, PA-C    Family History No family history on file.  Social History Social History   Tobacco Use  . Smoking status: Current Every Day Smoker    Packs/day: 0.20    Types: E-cigarettes  . Smokeless tobacco: Never Used  Substance Use Topics  . Alcohol use: Yes    Comment: few times a month  . Drug use: No     Allergies   Patient has no known allergies.   Review of Systems Review of Systems  Constitutional: Negative for chills and fever.  HENT: Negative for ear pain and sore throat.   Eyes: Negative for pain and visual disturbance.  Respiratory: Negative for cough and shortness of breath.   Cardiovascular: Negative for chest pain and palpitations.  Gastrointestinal: Negative for abdominal pain and vomiting.    Genitourinary: Negative for dysuria and hematuria.  Musculoskeletal: Negative for arthralgias and back pain.  Skin: Negative for color change and rash.  Neurological: Negative for seizures, syncope and headaches.  All other systems reviewed and are negative.    Physical Exam Updated Vital Signs  ED Triage Vitals  Enc Vitals Group     BP 10/24/18 0334 (!) 152/99     Pulse Rate 10/24/18 0334 (!) 121     Resp 10/24/18 0334 18     Temp 10/24/18 0720 98.3 F (36.8 C)     Temp src --      SpO2 10/24/18 0325 100 %     Weight 10/24/18 0336 136 lb 0.4 oz (61.7 kg)     Height 10/24/18 0336 5\' 9"  (1.753 m)     Head Circumference --      Peak Flow --      Pain Score --      Pain Loc --      Pain Edu? --      Excl. in GC? --     Physical Exam Vitals signs and nursing note reviewed.  Constitutional:      General: She is not in acute distress.    Appearance: She is well-developed.  HENT:     Head: Normocephalic and atraumatic.     Nose: Nose normal.     Mouth/Throat:     Mouth: Mucous membranes are moist.  Eyes:     Extraocular Movements: Extraocular movements intact.     Conjunctiva/sclera: Conjunctivae normal.     Pupils: Pupils are equal, round, and reactive to light.  Neck:     Musculoskeletal: Normal range of motion and neck supple.  Cardiovascular:     Rate and Rhythm: Normal rate and regular rhythm.     Pulses: Normal pulses.     Heart sounds: Normal heart sounds. No murmur.  Pulmonary:     Effort: Pulmonary effort is normal. No respiratory distress.     Breath sounds: Normal breath sounds.  Abdominal:     General: Bowel sounds are normal.     Palpations: Abdomen is soft.     Tenderness: There is no abdominal tenderness.  Musculoskeletal: Normal range of motion.  Skin:    General: Skin is warm and dry.  Neurological:     General: No focal deficit present.     Mental Status: She is alert and oriented to person, place, and time.     Cranial Nerves: No cranial  nerve deficit.     Sensory: No sensory deficit.     Motor: No  weakness.     Coordination: Coordination normal.  Psychiatric:        Mood and Affect: Mood normal.        Thought Content: Thought content does not include homicidal or suicidal ideation. Thought content does not include homicidal or suicidal plan.      ED Treatments / Results  Labs (all labs ordered are listed, but only abnormal results are displayed) Labs Reviewed  RAPID URINE DRUG SCREEN, HOSP PERFORMED - Abnormal; Notable for the following components:      Result Value   Opiates POSITIVE (*)    Cocaine POSITIVE (*)    Benzodiazepines POSITIVE (*)    All other components within normal limits  URINALYSIS, ROUTINE W REFLEX MICROSCOPIC - Abnormal; Notable for the following components:   Color, Urine STRAW (*)    All other components within normal limits  PREGNANCY, URINE    EKG None  Radiology No results found.  Procedures Procedures (including critical care time)  Medications Ordered in ED Medications - No data to display   Initial Impression / Assessment and Plan / ED Course  I have reviewed the triage vital signs and the nursing notes.  Pertinent labs & imaging results that were available during my care of the patient were reviewed by me and considered in my medical decision making (see chart for details).     Jennifer Walton is a 22 year old female with no significant medical problems who presents the ED after accidental overdose on heroin.  Patient with normal vitals.  No fever.  Patient was given Narcan by EMS about 4 hours ago.  Patient upon my evaluation is hemodynamically stable.  Neurologically intact.  Patient without any respiratory symptoms.  Patient with accidental overdose.  Patient is already plugged into Narcotics Anonymous.  She is given education about drug use.  Given Narcan and further resources for rehab.  Patient denies any suicidal homicidal ideation.  Discharged from ED in good  condition.  This chart was dictated using voice recognition software.  Despite best efforts to proofread,  errors can occur which can change the documentation meaning.  Final Clinical Impressions(s) / ED Diagnoses   Final diagnoses:  Opioid abuse [facility](HCC)    ED Discharge Orders         Ordered    naloxone [facility](NARCAN) nasal spray 4 mg/0.1 mL   Once     10/24/18 0805           Virgina NorfolkCuratolo, Briella Hobday, DO 10/24/18 0805

## 2018-10-24 NOTE — ED Triage Notes (Signed)
Pt arriving via [facility] from home. Pt reports nausea due to heroin use and intoxication. Pt received 1mg  Narcan IV.

## 2018-10-24 NOTE — ED Notes (Signed)
Bed: [id number] Expected date:  Expected time:  Means of arrival:  Comments: 22 yr old female narcan

## 2018-12-02 ENCOUNTER — Ambulatory Visit (HOSPITAL_COMMUNITY)
Admission: EM | Admit: 2018-12-02 | Discharge: 2018-12-02 | Disposition: A | Discharge status: Home / Self Care | Payer: BLUE CROSS/BLUE SHIELD | Attending: Family Medicine | Admitting: Family Medicine

## 2018-12-02 ENCOUNTER — Other Ambulatory Visit: Payer: Self-pay

## 2018-12-02 ENCOUNTER — Encounter (HOSPITAL_COMMUNITY): Payer: Self-pay | Admitting: Emergency Medicine

## 2018-12-02 ENCOUNTER — Ambulatory Visit (INDEPENDENT_AMBULATORY_CARE_PROVIDER_SITE_OTHER): Payer: BLUE CROSS/BLUE SHIELD

## 2018-12-02 DIAGNOSIS — R69 Illness, unspecified: Secondary | ICD-10-CM

## 2018-12-02 DIAGNOSIS — J111 Influenza due to unidentified influenza virus with other respiratory manifestations: Secondary | ICD-10-CM

## 2018-12-02 DIAGNOSIS — S6991XA Unspecified injury of right wrist, hand and finger(s), initial encounter: Secondary | ICD-10-CM

## 2018-12-02 DIAGNOSIS — M79641 Pain in right hand: Secondary | ICD-10-CM | POA: Diagnosis not present

## 2018-12-02 MED ORDER — IBUPROFEN 800 MG PO TABS: 800.0000 mg | ORAL_TABLET | Freq: Three times a day (TID) | ORAL | 0 refills | Status: DC

## 2018-12-02 MED ORDER — OSELTAMIVIR PHOSPHATE 75 MG PO CAPS: 75.0000 mg | ORAL_CAPSULE | Freq: Two times a day (BID) | ORAL | 0 refills | Status: AC
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
## 2018-12-02 NOTE — Discharge Instructions (Signed)
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
## 2018-12-28 ENCOUNTER — Other Ambulatory Visit: Payer: Self-pay

## 2018-12-28 ENCOUNTER — Emergency Department (HOSPITAL_COMMUNITY)
Admission: EM | Admit: 2018-12-28 | Discharge: 2018-12-28 | Disposition: A | Discharge status: Home / Self Care | Payer: BLUE CROSS/BLUE SHIELD | Attending: Emergency Medicine | Admitting: Emergency Medicine

## 2018-12-28 ENCOUNTER — Encounter (HOSPITAL_COMMUNITY): Payer: Self-pay | Admitting: Emergency Medicine

## 2018-12-28 DIAGNOSIS — Z79899 Other long term (current) drug therapy: Secondary | ICD-10-CM | POA: Diagnosis not present

## 2018-12-28 DIAGNOSIS — R1110 Vomiting, unspecified: Secondary | ICD-10-CM | POA: Diagnosis not present

## 2018-12-28 DIAGNOSIS — F17210 Nicotine dependence, cigarettes, uncomplicated: Secondary | ICD-10-CM | POA: Insufficient documentation

## 2018-12-28 DIAGNOSIS — F10929 Alcohol use, unspecified with intoxication, unspecified: Secondary | ICD-10-CM

## 2018-12-28 DIAGNOSIS — J45909 Unspecified asthma, uncomplicated: Secondary | ICD-10-CM | POA: Diagnosis not present

## 2018-12-28 LAB — I-STAT BETA HCG BLOOD, ED (MC, WL, AP ONLY): I-stat hCG, quantitative: <5 m[IU]/mL (ref <5)

## 2018-12-28 LAB — ETHANOL: Alcohol, Ethyl (B): 54 mg/dL — ABNORMAL HIGH (ref <10)

## 2018-12-28 MED ORDER — PROMETHAZINE HCL 25 MG/ML IJ SOLN: 12.5000 mg | Freq: Once | INTRAMUSCULAR | Status: DC

## 2018-12-28 MED ORDER — ONDANSETRON HCL 4 MG/2ML IJ SOLN
4.0000 mg | Freq: Once | INTRAMUSCULAR | Status: AC
  Administered 2018-12-28: 4 mg via INTRAVENOUS
  Filled 2018-12-28: qty 2

## 2018-12-28 MED ORDER — PANTOPRAZOLE SODIUM 40 MG IV SOLR
40.0000 mg | Freq: Once | INTRAVENOUS | Status: AC
  Administered 2018-12-28: 40 mg via INTRAVENOUS
  Filled 2018-12-28: qty 40

## 2018-12-28 MED ORDER — SODIUM CHLORIDE 0.9 % IV BOLUS
1000.0000 mL | Freq: Once | INTRAVENOUS | Status: AC
  Administered 2018-12-28: 1000 mL via INTRAVENOUS

## 2018-12-28 MED ORDER — PROMETHAZINE HCL 25 MG/ML IJ SOLN: 12.5000 mg | Freq: Once | INTRAMUSCULAR | Status: DC | PRN

## 2018-12-28 MED ORDER — PROMETHAZINE HCL 25 MG/ML IJ SOLN
12.5000 mg | Freq: Once | INTRAMUSCULAR | Status: AC
  Administered 2018-12-28: 12.5 mg via INTRAVENOUS
  Filled 2018-12-28: qty 1

## 2018-12-28 MED ORDER — PROMETHAZINE HCL 25 MG PO TABS: 25.0000 mg | ORAL_TABLET | Freq: Four times a day (QID) | ORAL | 0 refills | Status: AC | PRN

## 2018-12-28 MED ORDER — HALOPERIDOL LACTATE 5 MG/ML IJ SOLN
2.0000 mg | Freq: Once | INTRAMUSCULAR | Status: AC
  Administered 2018-12-28: 2 mg via INTRAVENOUS
  Filled 2018-12-28: qty 1
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
## 2018-12-28 NOTE — ED Triage Notes (Signed)
Per EMS: Pt has hx of binge drinking.  Had appx 5 shots and a pitcher of beer.  Went home and woke up w/ NV. Has been vomiting for 2 hrs.

## 2018-12-28 NOTE — ED Provider Notes (Signed)
  Provider Note MRN:  [id number]  Arrival date & time: 12/28/18    ED Course and Medical Decision Making  I received sign out for this patient at shift change from Dr. Read Drivers.  Continued retching requiring single dose of Haldol, with significant improvement.  Observed for 4+ hours in the ED.  Appropriate for discharge.  Elmer Sow. Pilar Plate, MD [facility] Emergency Medicine [facility] [email or web address]     Sabas Sous, MD 12/28/18 1028

## 2018-12-28 NOTE — ED Notes (Signed)
Bed: WA13 Expected date:  Expected time:  Means of arrival:  Comments: N/V 

## 2018-12-28 NOTE — ED Notes (Signed)
No active vomiting noted from patient since my arrival. Pt resting in bed comfortably.

## 2018-12-29 ENCOUNTER — Emergency Department (HOSPITAL_COMMUNITY)
Admission: EM | Admit: 2018-12-29 | Discharge: 2018-12-29 | Disposition: A | Discharge status: Home / Self Care | Payer: BLUE CROSS/BLUE SHIELD | Attending: Emergency Medicine | Admitting: Emergency Medicine

## 2018-12-29 ENCOUNTER — Emergency Department (HOSPITAL_COMMUNITY)
Admission: EM | Admit: 2018-12-29 | Discharge: 2018-12-30 | Disposition: A | Discharge status: Home / Self Care | Payer: BLUE CROSS/BLUE SHIELD | Source: Home / Self Care | Attending: Emergency Medicine | Admitting: Emergency Medicine

## 2018-12-29 ENCOUNTER — Encounter (HOSPITAL_COMMUNITY): Payer: Self-pay | Admitting: Emergency Medicine

## 2018-12-29 ENCOUNTER — Other Ambulatory Visit: Payer: Self-pay

## 2018-12-29 ENCOUNTER — Ambulatory Visit (INDEPENDENT_AMBULATORY_CARE_PROVIDER_SITE_OTHER)
Admission: EM | Admit: 2018-12-29 | Discharge: 2018-12-29 | Disposition: A | Discharge status: Home / Self Care | Payer: BLUE CROSS/BLUE SHIELD | Source: Home / Self Care

## 2018-12-29 DIAGNOSIS — Z5321 Procedure and treatment not carried out due to patient leaving prior to being seen by health care provider: Secondary | ICD-10-CM | POA: Diagnosis not present

## 2018-12-29 DIAGNOSIS — J45909 Unspecified asthma, uncomplicated: Secondary | ICD-10-CM | POA: Insufficient documentation

## 2018-12-29 DIAGNOSIS — R1033 Periumbilical pain: Secondary | ICD-10-CM

## 2018-12-29 DIAGNOSIS — F17290 Nicotine dependence, other tobacco product, uncomplicated: Secondary | ICD-10-CM

## 2018-12-29 DIAGNOSIS — R1031 Right lower quadrant pain: Secondary | ICD-10-CM | POA: Diagnosis not present

## 2018-12-29 DIAGNOSIS — R112 Nausea with vomiting, unspecified: Secondary | ICD-10-CM | POA: Insufficient documentation

## 2018-12-29 DIAGNOSIS — N289 Disorder of kidney and ureter, unspecified: Secondary | ICD-10-CM | POA: Insufficient documentation

## 2018-12-29 DIAGNOSIS — K529 Noninfective gastroenteritis and colitis, unspecified: Secondary | ICD-10-CM

## 2018-12-29 DIAGNOSIS — R197 Diarrhea, unspecified: Secondary | ICD-10-CM | POA: Diagnosis not present

## 2018-12-29 LAB — COMPREHENSIVE METABOLIC PANEL
ALK PHOS: 43 U/L (ref 38–126)
ALT: 17 U/L (ref 0–44)
ALT: 17 U/L (ref 0–44)
AST: 29 U/L (ref 15–41)
AST: 33 U/L (ref 15–41)
Albumin: 4.9 g/dL (ref 3.5–5.0)
Albumin: 4.8 g/dL (ref 3.5–5.0)
Alkaline Phosphatase: 49 U/L (ref 38–126)
Anion gap: 11 (ref 5–15)
Anion gap: 10 (ref 5–15)
BUN: 15 mg/dL (ref 6–20)
BUN: 13 mg/dL (ref 6–20)
CHLORIDE: 102 mmol/L (ref 98–111)
CO2: 23 mmol/L (ref 22–32)
CO2: 25 mmol/L (ref 22–32)
Calcium: 9.4 mg/dL (ref 8.9–10.3)
Calcium: 9.5 mg/dL (ref 8.9–10.3)
Chloride: 102 mmol/L (ref 98–111)
Creatinine, Ser: 0.79 mg/dL (ref 0.44–1.00)
Creatinine, Ser: 0.88 mg/dL (ref 0.44–1.00)
GFR calc Af Amer: >60 mL/min (ref >60)
GFR calc Af Amer: >60 mL/min (ref >60)
GFR calc non Af Amer: >60 mL/min (ref >60)
GFR calc non Af Amer: >60 mL/min (ref >60)
Glucose, Bld: 97 mg/dL (ref 70–99)
Glucose, Bld: 97 mg/dL (ref 70–99)
Potassium: 3.1 mmol/L — ABNORMAL LOW (ref 3.5–5.1)
Potassium: 3.3 mmol/L — ABNORMAL LOW (ref 3.5–5.1)
SODIUM: 137 mmol/L (ref 135–145)
Sodium: 136 mmol/L (ref 135–145)
Total Bilirubin: 1.3 mg/dL — ABNORMAL HIGH (ref 0.3–1.2)
Total Bilirubin: 1.4 mg/dL — ABNORMAL HIGH (ref 0.3–1.2)
Total Protein: 8.4 g/dL — ABNORMAL HIGH (ref 6.5–8.1)
Total Protein: 8.5 g/dL — ABNORMAL HIGH (ref 6.5–8.1)

## 2018-12-29 LAB — URINALYSIS, ROUTINE W REFLEX MICROSCOPIC
Bilirubin Urine: NEGATIVE
Glucose, UA: NEGATIVE mg/dL
Hgb urine dipstick: NEGATIVE
Ketones, ur: 20 mg/dL — AB
Leukocytes,Ua: NEGATIVE
Nitrite: NEGATIVE
Protein, ur: 100 mg/dL — AB
Specific Gravity, Urine: 1.032 — ABNORMAL HIGH (ref 1.005–1.030)
pH: 5 (ref 5.0–8.0)

## 2018-12-29 LAB — CBC
HCT: 43.8 % (ref 36.0–46.0)
HCT: 44.8 % (ref 36.0–46.0)
Hemoglobin: 14.2 g/dL (ref 12.0–15.0)
Hemoglobin: 14.3 g/dL (ref 12.0–15.0)
MCH: 30.5 pg (ref 26.0–34.0)
MCH: 30.2 pg (ref 26.0–34.0)
MCHC: 32.4 g/dL (ref 30.0–36.0)
MCHC: 31.9 g/dL (ref 30.0–36.0)
MCV: 94.2 fL (ref 80.0–100.0)
MCV: 94.5 fL (ref 80.0–100.0)
PLATELETS: 298 10*3/uL (ref 150–400)
Platelets: 322 10*3/uL (ref 150–400)
RBC: 4.65 MIL/uL (ref 3.87–5.11)
RBC: 4.74 MIL/uL (ref 3.87–5.11)
RDW: 12.3 % (ref 11.5–15.5)
RDW: 12.3 % (ref 11.5–15.5)
WBC: 11.7 10*3/uL — ABNORMAL HIGH (ref 4.0–10.5)
WBC: 11 10*3/uL — ABNORMAL HIGH (ref 4.0–10.5)
nRBC: 0 % (ref 0.0–0.2)
nRBC: 0 % (ref 0.0–0.2)

## 2018-12-29 LAB — I-STAT BETA HCG BLOOD, ED (MC, WL, AP ONLY)
I-stat hCG, quantitative: <5 m[IU]/mL (ref <5)
I-stat hCG, quantitative: <5 m[IU]/mL (ref <5)

## 2018-12-29 LAB — LIPASE, BLOOD
Lipase: 35 U/L (ref 11–51)
Lipase: 26 U/L (ref 11–51)

## 2018-12-29 MED ORDER — ONDANSETRON HCL 4 MG/2ML IJ SOLN
4.0000 mg | Freq: Once | INTRAMUSCULAR | Status: AC
  Administered 2018-12-29: 4 mg via INTRAMUSCULAR

## 2018-12-29 MED ORDER — SODIUM CHLORIDE 0.9% FLUSH: 3.0000 mL | Freq: Once | INTRAVENOUS | Status: DC

## 2018-12-29 MED ORDER — ONDANSETRON HCL 4 MG/2ML IJ SOLN
INTRAMUSCULAR | Status: AC
  Filled 2018-12-29: qty 2

## 2018-12-29 NOTE — ED Provider Notes (Signed)
[facility]   [id number] 12/29/18 Arrival Time: 1854  CC: ABDOMINAL pain, N/V/D  SUBJECTIVE:  Jennifer Walton is a 23 y.o. female who presents with complaint of abdominal pain, nausea, vomiting approximately every 30 minutes, and diarrhea x 2-3 episodes that began 2 days ago.  Symptoms began after drinking alcohol.  Was seen in the ED yesterday and given IV fluids with relief.  Was prescribed promethazine, but unable to pick up due to symptoms.  Pain is diffuse about the abdomen.  Describes as worsening, constant and achy/ sharp in character.  Has not tried OTC medications.  Symptoms made worse with eating and drinking.  States she has been unable keep fluids down.  Denies similar symptoms in the past.  Complains of decreased appetite.    Denies fever, chills, chest pain, SOB, constipation, hematochezia, melena, dysuria, difficulty urinating, increased frequency or urgency, flank pain, loss of bowel or bladder function.    No LMP recorded. Patient has had an implant.  ROS: As per HPI.  Past Medical History:  Diagnosis Date  . Anxiety   . Asthma    Past Surgical History:  Procedure Laterality Date  . KNEE SURGERY     No Known Allergies No current facility-administered medications on file prior to encounter.    Current Outpatient Medications on File Prior to Encounter  Medication Sig Dispense Refill  . promethazine (PHENERGAN) 25 MG tablet Take 1 tablet (25 mg total) by mouth every 6 (six) hours as needed for nausea or vomiting. 12 tablet 0   Social History   Socioeconomic History  . Marital status: Single    Spouse name: Not on file  . Number of children: Not on file  . Years of education: Not on file  . Highest education level: Not on file  Occupational History  . Not on file  Social Needs  . Financial resource strain: Not on file  . Food insecurity:    Worry: Not on file    Inability: Not on file  . Transportation needs:    Medical: Not on file    Non-medical:  Not on file  Tobacco Use  . Smoking status: Current Every Day Smoker    Packs/day: 0.20    Types: E-cigarettes  . Smokeless tobacco: Never Used  Substance and Sexual Activity  . Alcohol use: Yes    Comment: few times a month  . Drug use: No  . Sexual activity: Not on file  Lifestyle  . Physical activity:    Days per week: Not on file    Minutes per session: Not on file  . Stress: Not on file  Relationships  . Social connections:    Talks on phone: Not on file    Gets together: Not on file    Attends religious service: Not on file    Active member of club or organization: Not on file    Attends meetings of clubs or organizations: Not on file    Relationship status: Not on file  . Intimate partner violence:    Fear of current or ex partner: Not on file    Emotionally abused: Not on file    Physically abused: Not on file    Forced sexual activity: Not on file  Other Topics Concern  . Not on file  Social History Narrative  . Not on file   Family History  Problem Relation Age of Onset  . Healthy Mother      OBJECTIVE:  Vitals:   12/29/18 1936  12/29/18 1938  BP:  (!) 143/84  Pulse:  78  Resp:  18  Temp:  98 F (36.7 C)  TempSrc:  Temporal  SpO2:  100%  Weight: 140 lb (63.5 kg)   Height: 5\' 9"  (1.753 m)     General appearance: Alert; ill appearing; laying on exam table HEENT: NCAT.  Oropharynx clear.  Lungs: clear to auscultation bilaterally without adventitious breath sounds Heart: regular rate and rhythm.  Radial pulses 2+ symmetrical bilaterally Abdomen: soft, non-distended; hypoactive bowel sounds; diffuse TTP, specifically TTP over RLQ and periumbilical region; tender at McBurney's point; negative rebound; + guarding Extremities: no edema; symmetrical with no gross deformities Skin: warm and dry Neurologic: normal gait Psychological: alert and cooperative; normal mood and affect  LABS: Results for orders placed or performed during the hospital encounter  of 12/29/18 (from the past 24 hour(s))  Lipase, blood     Status: None   Collection Time: 12/29/18 11:49 AM  Result Value Ref Range   Lipase 35 11 - 51 U/L  Comprehensive metabolic panel     Status: Abnormal   Collection Time: 12/29/18 11:49 AM  Result Value Ref Range   Sodium 136 135 - 145 mmol/L   Potassium 3.1 (L) 3.5 - 5.1 mmol/L   Chloride 102 98 - 111 mmol/L   CO2 23 22 - 32 mmol/L   Glucose, Bld 97 70 - 99 mg/dL   BUN 15 6 - 20 mg/dL   Creatinine, Ser 4.78 0.44 - 1.00 mg/dL   Calcium 9.4 8.9 - 41.2 mg/dL   Total Protein 8.4 (H) 6.5 - 8.1 g/dL   Albumin 4.9 3.5 - 5.0 g/dL   AST 29 15 - 41 U/L   ALT 17 0 - 44 U/L   Alkaline Phosphatase 43 38 - 126 U/L   Total Bilirubin 1.3 (H) 0.3 - 1.2 mg/dL   GFR calc non Af Amer >60 >60 mL/min   GFR calc Af Amer >60 >60 mL/min   Anion gap 11 5 - 15  CBC     Status: Abnormal   Collection Time: 12/29/18 11:49 AM  Result Value Ref Range   WBC 11.7 (H) 4.0 - 10.5 K/uL   RBC 4.65 3.87 - 5.11 MIL/uL   Hemoglobin 14.2 12.0 - 15.0 g/dL   HCT 82.0 81.3 - 88.7 %   MCV 94.2 80.0 - 100.0 fL   MCH 30.5 26.0 - 34.0 pg   MCHC 32.4 30.0 - 36.0 g/dL   RDW 19.5 97.4 - 71.8 %   Platelets 322 150 - 400 K/uL   nRBC 0.0 0.0 - 0.2 %  I-Stat beta hCG blood, ED     Status: None   Collection Time: 12/29/18 11:52 AM  Result Value Ref Range   I-stat hCG, quantitative <5.0 <5 mIU/mL   Comment 3           ASSESSMENT & PLAN:  1. Nausea vomiting and diarrhea   2. Right lower quadrant pain   3. Periumbilical pain     Meds ordered this encounter  Medications  . ondansetron (ZOFRAN) injection 4 mg   Zofran given in office Recommending further evaluation and management for persistent nausea, vomiting, diarrhea and RLQ and periumbilical pain.  Unable to keep fluids down.   Patient aware and in agreement with this plan.  Transported to ED by wheelchair.     Rennis Harding, PA-C 12/29/18 2021

## 2018-12-29 NOTE — ED Triage Notes (Signed)
Patient reports low abdominal pain with emesis and diarrhea onset this week , no fever or chills .  

## 2018-12-29 NOTE — Discharge Instructions (Addendum)
Zofran given in office Recommending further evaluation and management for persistent nausea, vomiting, diarrhea and RLQ and periumbilical pain.  Unable to keep fluids down.   Patient aware and in agreement with this plan.  Transported to ED by wheelchair.

## 2018-12-29 NOTE — ED Triage Notes (Signed)
Per pt she has been having N/V and some diarrhea for 2 days now. Abdominal pain in her lower abdominal area. Pt has no fever and no chills.

## 2018-12-29 NOTE — ED Triage Notes (Signed)
Per EMS, patient from home, c/o N/V x2 days. Seen x2 days ago for same.

## 2018-12-30 ENCOUNTER — Emergency Department (HOSPITAL_COMMUNITY): Payer: BLUE CROSS/BLUE SHIELD

## 2018-12-30 MED ORDER — KETOROLAC TROMETHAMINE 30 MG/ML IJ SOLN
30.0000 mg | Freq: Once | INTRAMUSCULAR | Status: AC
  Administered 2018-12-30: 30 mg via INTRAVENOUS
  Filled 2018-12-30: qty 1

## 2018-12-30 MED ORDER — ONDANSETRON HCL 4 MG/2ML IJ SOLN
4.0000 mg | Freq: Once | INTRAMUSCULAR | Status: AC
  Administered 2018-12-30: 4 mg via INTRAVENOUS
  Filled 2018-12-30: qty 2

## 2018-12-30 MED ORDER — IOHEXOL 300 MG/ML  SOLN
100.0000 mL | Freq: Once | INTRAMUSCULAR | Status: AC | PRN
  Administered 2018-12-30: 100 mL via INTRAVENOUS

## 2018-12-30 MED ORDER — ONDANSETRON 4 MG PO TBDP: 4.0000 mg | ORAL_TABLET | Freq: Three times a day (TID) | ORAL | 0 refills | Status: AC | PRN

## 2018-12-30 MED ORDER — SODIUM CHLORIDE 0.9 % IV BOLUS
2000.0000 mL | Freq: Once | INTRAVENOUS | Status: AC
  Administered 2018-12-30: 1000 mL via INTRAVENOUS

## 2018-12-30 NOTE — Discharge Instructions (Signed)
Avoid fried foods, fatty foods, greasy foods, and milk products until symptoms resolve. Be sure you drink plenty of clear liquids. We recommend the use of Zofran as prescribed for nausea/vomiting. Follow-up with a primary care doctor to ensure resolution of symptoms.  You were also found to have many hypodense lesions on your kidneys.  These are felt to represent cysts, but will likely require further work-up with an MRI/additional imaging.  It is possible that your imaging results may represent polycystic kidney disease.  You have been given a referral to a nephrologist for outpatient follow-up should you desire to see a provider in [place].  You may return to the ED for new or concerning symptoms.

## 2018-12-30 NOTE — ED Notes (Signed)
Pt given apple juice  

## 2018-12-30 NOTE — ED Provider Notes (Signed)
[facility] EMERGENCY DEPARTMENT Provider Note   CSN: [id number] Arrival date & time: 12/29/18  2035    History   Chief Complaint Chief Complaint  Patient presents with  . Abdominal Pain    Emesis/Diarrhea    HPI Jennifer Walton is a 23 y.o. female.    23 year old female with a history of asthma and anxiety presents to the emergency department for evaluation of abdominal pain.  Onset of symptoms was 2 days ago after drinking alcohol.  She has had persistent vomiting, experiencing one episode of emesis per hour.  Notes onset of lower abdominal pain yesterday evening.  This has remained constant.  She denies taking any medications for her symptoms.  No fevers, chest pain, dysuria, hematuria, vaginal discharge, significant bowel changes.  Urgent care note references diarrhea, though patient states that she has had fairly normal stool with only a few looser episodes.  No known sick contacts.  LMP yesterday. No hx of abdominal surgeries.   Abdominal Pain    Past Medical History:  Diagnosis Date  . Anxiety   . Asthma     Patient Active Problem List   Diagnosis Date Noted  . 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 KNEE SURGERY       OB History    Gravida  1   Para      Term      Preterm      AB      Living        SAB      TAB      Ectopic      Multiple      Live Births               Home Medications    Prior to Admission medications   Medication Sig Start Date End Date Taking? Authorizing Provider  ondansetron (ZOFRAN ODT) 4 MG disintegrating tablet Take 1 tablet (4 mg total) by mouth every 8 (eight) hours as needed for nausea or vomiting. 12/30/18   Antony Madura, PA-C  promethazine (PHENERGAN) 25 MG tablet Take 1 tablet (25 mg total) by mouth every 6 (six) hours as needed for nausea or vomiting. 12/28/18   Molpus, Jonny Ruiz, MD    Family  History Family History  Problem Relation Age of Onset  . Healthy Mother     Social History Social History   Tobacco Use  . Smoking status: Current Every Day Smoker    Packs/day: 0.20    Types: E-cigarettes  . Smokeless tobacco: Never Used  Substance Use Topics  . Alcohol use: Yes    Comment: few times a month  . Drug use: No     Allergies   Patient has no known allergies.   Review of Systems Review of Systems  Gastrointestinal: Positive for abdominal pain.  Ten systems reviewed and are negative for acute change, except as noted in the HPI.    Physical Exam Updated Vital Signs BP 128/78   Pulse 63   Temp 97.8 F (36.6 C) (Oral)   Resp 16   LMP 12/27/2018   SpO2 100%   Physical Exam Vitals signs and nursing note reviewed.  Constitutional:      General: She is not in acute distress.    Appearance: She is well-developed. She is not diaphoretic.     Comments: Appears fatigued, but nontoxic.  HENT:     Head:  Normocephalic and atraumatic.     Mouth/Throat:     Comments: Mild cheilitis Dry mm Eyes:     General: No scleral icterus.    Conjunctiva/sclera: Conjunctivae normal.  Neck:     Musculoskeletal: Normal range of motion.  Cardiovascular:     Rate and Rhythm: Normal rate and regular rhythm.     Pulses: Normal pulses.  Pulmonary:     Effort: Pulmonary effort is normal. No respiratory distress.     Breath sounds: No stridor. No wheezing.     Comments: Respirations even and unlabored Abdominal:     Palpations: Abdomen is soft. There is no mass.     Tenderness: There is abdominal tenderness (bilateral lower quadrants).     Hernia: No hernia is present.     Comments: No peritoneal signs.  Musculoskeletal: Normal range of motion.  Skin:    General: Skin is warm and dry.     Coloration: Skin is not pale.     Findings: No erythema or rash.  Neurological:     Mental Status: She is alert and oriented to person, place, and time.     Coordination:  Coordination normal.  Psychiatric:        Behavior: Behavior normal.      ED Treatments / Results  Labs (all labs ordered are listed, but only abnormal results are displayed) Labs Reviewed  COMPREHENSIVE METABOLIC PANEL - Abnormal; Notable for the following components:      Result Value   Potassium 3.3 (*)    Total Protein 8.5 (*)    Total Bilirubin 1.4 (*)    All other components within normal limits  CBC - Abnormal; Notable for the following components:   WBC 11.0 (*)    All other components within normal limits  URINALYSIS, ROUTINE W REFLEX MICROSCOPIC - Abnormal; Notable for the following components:   APPearance HAZY (*)    Specific Gravity, Urine 1.032 (*)    Ketones, ur 20 (*)    Protein, ur 100 (*)    Bacteria, UA FEW (*)    All other components within normal limits  LIPASE, BLOOD  I-STAT BETA HCG BLOOD, ED ([facility], [facility], [facility] ONLY)    EKG None  Radiology Ct Abdomen Pelvis W Contrast  Result Date: 12/30/2018 CLINICAL DATA:  23 year old female with left lower quadrant abdominal pain. EXAM: CT ABDOMEN AND PELVIS WITH CONTRAST TECHNIQUE: Multidetector CT imaging of the abdomen and pelvis was performed using the standard protocol following bolus administration of intravenous contrast. CONTRAST:  100mL OMNIPAQUE IOHEXOL 300 MG/ML  SOLN COMPARISON:  Abdominal radiograph dated 12/23/2017 FINDINGS: Lower chest: The visualized lung bases are clear. No intra-abdominal free air or free fluid. Hepatobiliary: No focal liver abnormality is seen. No gallstones, gallbladder wall thickening, or biliary dilatation. Pancreas: Unremarkable. No pancreatic ductal dilatation or surrounding inflammatory changes. Spleen: Normal in size without focal abnormality. Adrenals/Urinary Tract: The adrenal glands are unremarkable. Innumerable bilateral renal hypodense lesions measuring up to 1.8 cm in the upper pole of the right kidney. The larger lesions demonstrate fluid attenuation most consistent with cysts  and the smaller lesions are too small to characterize. MRI may provide better characterization on a nonemergent basis. There is a 2 mm nonobstructing left renal inferior pole calculus. There is no hydronephrosis on either side. The urinary bladder is grossly unremarkable. Stomach/Bowel: Mild diffuse thickened appearance of the colon, likely related to underdistention. Clinical correlation is recommended to exclude mild colitis. There is no bowel obstruction. The appendix is normal. Vascular/Lymphatic:  The abdominal aorta and IVC appear unremarkable. No portal venous gas. There is no adenopathy. Reproductive: The uterus is retroverted or retroflexed and grossly unremarkable. No pelvic mass. Other: Small fat containing umbilical hernia. Musculoskeletal: No acute or significant osseous findings. IMPRESSION: 1. Underdistention of the colon versus less likely mild colitis. Clinical correlation is recommended. No bowel obstruction. Normal appendix. 2. Innumerable bilateral renal hypodense lesions, most likely cyst. MRI may provide better characterization. 3. A 2 mm nonobstructing left renal inferior pole calculus. No hydronephrosis. Electronically Signed   By: Elgie Collard M.D.   On: 12/30/2018 03:00    Procedures Procedures (including critical care time)  Medications Ordered in ED Medications  sodium chloride flush (NS) 0.9 % injection 3 mL (3 mLs Intravenous Not Given 12/30/18 0346)  ketorolac (TORADOL) 30 MG/ML injection 30 mg (30 mg Intravenous Given 12/30/18 0202)  sodium chloride 0.9 % bolus 2,000 mL (0 mLs Intravenous Stopped 12/30/18 0344)  ondansetron (ZOFRAN) injection 4 mg (4 mg Intravenous Given 12/30/18 0202)  iohexol (OMNIPAQUE) 300 MG/ML solution 100 mL (100 mLs Intravenous Contrast Given 12/30/18 0236)  ondansetron (ZOFRAN) injection 4 mg (4 mg Intravenous Given 12/30/18 0451)    4:46 AM Tolerating PO apple juice.   Initial Impression / Assessment and Plan / ED Course  I have reviewed  the triage vital signs and the nursing notes.  Pertinent labs & imaging results that were available during my care of the patient were reviewed by me and considered in my medical decision making (see chart for details).        Patient with symptoms consistent with gastroenteritis.  Vitals are stable, no fever.  Initial abdominal exam soft, nondistended.  Tenderness noted in bilateral lower quadrants.  Labs today suggestive of dehydration with ketones in urine.  There is a mild leukocytosis likely secondary to viral etiology.  CT scan today shows findings favoring under distention of the colon.  No bowel obstruction.  Normal appendix.  Pain has resolved following Toradol, IV fluids.  She is tolerating apple juice after receiving Zofran.  As an aside, CT did reveal innumerable bilateral renal hypodense lesions.  These are favored to represent cysts.  Patient does endorse a strong family history of kidney disease in her father, aunt, grandmother.  Her imaging is suspicious for polycystic kidney disease.  She has been given referral to nephrology for this reason.  Discussed need for further outpatient imaging.  Patient verbalizes understanding.  Will discharge with Zofran to continue at home.  Counseled on clear liquid diet.  Return precautions discussed and provided.  Patient discharged in stable condition with no unaddressed concerns.   Final Clinical Impressions(s) / ED Diagnoses   Final diagnoses:  Gastroenteritis  Lesion of both native kidneys    ED Discharge Orders         Ordered    ondansetron (ZOFRAN ODT) 4 MG disintegrating tablet  Every 8 hours PRN     12/30/18 0444           Antony Madura, PA-C 12/30/18 3762    Zadie Rhine, MD 12/30/18 [phone]

## 2019-02-05 ENCOUNTER — Emergency Department (HOSPITAL_COMMUNITY): Payer: BLUE CROSS/BLUE SHIELD

## 2019-02-05 ENCOUNTER — Emergency Department (HOSPITAL_COMMUNITY)
Admission: EM | Admit: 2019-02-05 | Discharge: 2019-02-05 | Discharge status: Against Medical Advice | Payer: BLUE CROSS/BLUE SHIELD | Attending: Emergency Medicine | Admitting: Emergency Medicine

## 2019-02-05 ENCOUNTER — Other Ambulatory Visit: Payer: Self-pay

## 2019-02-05 DIAGNOSIS — F1490 Cocaine use, unspecified, uncomplicated: Secondary | ICD-10-CM | POA: Insufficient documentation

## 2019-02-05 DIAGNOSIS — T50901A Poisoning by unspecified drugs, medicaments and biological substances, accidental (unintentional), initial encounter: Secondary | ICD-10-CM | POA: Insufficient documentation

## 2019-02-05 DIAGNOSIS — F17200 Nicotine dependence, unspecified, uncomplicated: Secondary | ICD-10-CM | POA: Diagnosis not present

## 2019-02-05 DIAGNOSIS — Z5320 Procedure and treatment not carried out because of patient's decision for unspecified reasons: Secondary | ICD-10-CM | POA: Insufficient documentation

## 2019-02-05 DIAGNOSIS — Z8709 Personal history of other diseases of the respiratory system: Secondary | ICD-10-CM | POA: Diagnosis not present

## 2019-02-05 DIAGNOSIS — R51 Headache: Secondary | ICD-10-CM | POA: Insufficient documentation

## 2019-02-05 LAB — PREGNANCY, URINE: Preg Test, Ur: NEGATIVE

## 2019-02-05 MED ORDER — NALOXONE HCL 0.4 MG/0.4ML IJ SOAJ: INTRAMUSCULAR | 3 refills | Status: AC

## 2019-02-05 NOTE — ED Notes (Signed)
Pt refusing treatment and requesting to go AMA.   Does agree to Head CT.   Quiet and calm.

## 2019-02-05 NOTE — ED Provider Notes (Signed)
[facility]ERGENC[facility] Provider Note   CSN: [id number] Arrival date & time: 02/05/19  1654    History   Chief Complaint No chief complaint on file.   HPI Jennifer Walton is a 23 y.o. female.     HPI Pt presents with overdose by EMS  Found by roommate by pool.  Agonal breathing initially noted by EMS.  Bagged her until they got to the ambulance and administered 2mg  IN narcann with immediate response and alertness.  Admits to taking 1/2 a 20mg  bar of percocet, IV cocaine, multiple [place] alcoholic beverages, and mushrooms. Says this was recreational only.  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  She believes the cocaine may be laced with heroine. No recent illnesses or fevers.  No complaints at this time other than a Headache.  She occasionally gets headaches but states this is worse than normal. Has PCKD. Takes zoloft.  Denies intent to harm herself. Remembers taking the drugs, but does not recall what happened after that.  Past Medical History:  Diagnosis Date  . Anxiety   . Asthma     Patient Active Problem List   Diagnosis Date Noted  . 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 KNEE SURGERY       OB History    Gravida  1   Para      Term      Preterm      AB      Living        SAB      TAB      Ectopic      Multiple      Live Births               Home Medications    Prior to Admission medications   Medication Sig Start Date End Date Taking? Authorizing Provider  ondansetron (ZOFRAN ODT) 4 MG disintegrating tablet Take 1 tablet (4 mg total) by mouth every 8 (eight) hours as needed for nausea or vomiting. 12/30/18   Antony MaduraHumes, Kelly, PA-C  promethazine (PHENERGAN) 25 MG tablet Take 1 tablet (25 mg total) by mouth every 6 (six) hours as needed for nausea or vomiting. 12/28/18   Molpus, Jonny RuizJohn, MD    Family History  Family History  Problem Relation Age of Onset  . Healthy Mother     Social History Social History   Tobacco Use  . Smoking status: Current Every Day Smoker    Packs/day: 0.20    Types: E-cigarettes  . Smokeless tobacco: Never Used  Substance Use Topics  . Alcohol use: Yes    Comment: few times a month  . Drug use: No     Allergies   Patient has no known allergies.   Review of Systems Review of Systems  Constitutional: Negative for chills and fever.  HENT: Negative for ear pain and sore throat.   Eyes: Negative for pain and visual disturbance.  Respiratory: Negative for cough and shortness of breath.   Cardiovascular: Negative for chest pain and palpitations.  Gastrointestinal: Negative for abdominal pain and vomiting.  Genitourinary: Negative for dysuria and hematuria.  Musculoskeletal: Negative for arthralgias and back pain.  Skin: Negative for color change and rash.  Neurological: Positive for syncope and headaches. Negative for tremors, seizures, weakness and light-headedness.  All other systems reviewed and are negative.    Physical Exam Updated Vital Signs BP (!) 140/110 (  BP Location: Right Arm)   Pulse 96   Temp 98.6 F (37 C) (Oral)   Physical Exam Vitals signs and nursing note reviewed.  Constitutional:      Appearance: She is well-developed. She is not ill-appearing or diaphoretic.  HENT:     Head: Normocephalic and atraumatic.     Nose: No congestion or rhinorrhea.     Mouth/Throat:     Pharynx: No oropharyngeal exudate or posterior oropharyngeal erythema.  Eyes:     Conjunctiva/sclera: Conjunctivae normal.  Neck:     Musculoskeletal: Neck supple.  Cardiovascular:     Rate and Rhythm: Normal rate and regular rhythm.     Heart sounds: No murmur.  Pulmonary:     Effort: Pulmonary effort is normal. No respiratory distress.     Breath sounds: Normal breath sounds.  Abdominal:     Palpations: Abdomen is soft.     Tenderness: There is no  abdominal tenderness.  Musculoskeletal:        General: No deformity or signs of injury.     Right lower leg: No edema.     Left lower leg: No edema.  Skin:    General: Skin is warm and dry.  Neurological:     General: No focal deficit present.     Mental Status: She is alert and oriented to person, place, and time. Mental status is at baseline.     Cranial Nerves: No cranial nerve deficit.     Sensory: No sensory deficit.     Motor: No weakness.     Gait: Gait normal.     Deep Tendon Reflexes: Reflexes normal.     Comments: Gait normal.  CN II-XII intact. Moving all extremities with 5/5 strength.       ED Treatments / Results  Labs (all labs ordered are listed, but only abnormal results are displayed) Labs Reviewed  PREGNANCY, URINE    EKG None  Radiology No results found.  Procedures Procedures (including critical care time)  Medications Ordered in ED Medications - No data to display   Initial Impression / Assessment and Plan / ED Course  I have reviewed the triage vital signs and the nursing notes.  Pertinent labs & imaging results that were available during my care of the patient were reviewed by me and considered in my medical decision making (see chart for details).        Pt presents HDS with EMS for suspected polydrug OD.  Well appearing in the ED.  Observed for 1 hour and left AMA.  We had a full risk discussion about the need for further observation as the narcan may wear off before the drugs do and she may stop breathing again.  She understood her risk of death or serious injury.  CT head negative (obtained for hx of PCKD with new HA and syncopal episode). Not suicidal or homocidal.  EKG without rhythm dysuriac or QTC prolongation. Appropriate in the ED.  Narcan RX given to patient for possible future OD.  Told to return if any worrisome signs or sxs appear.    Final Clinical Impressions(s) / ED Diagnoses   Final diagnoses:  Accidental drug overdose,  initial encounter    ED Discharge Orders         Ordered    Naloxone HCl 0.4 MG/0.4ML SOAJ     02/05/19 1800           Ina Kick, MD 02/05/19 Claria Dice    Alvira Monday, MD 02/10/19 1406

## 2019-02-05 NOTE — ED Notes (Signed)
Pt refusing to keep cardiac monitor on

## 2019-02-05 NOTE — ED Triage Notes (Signed)
reporrtedly found unconscious by friends by a pool.   Last seen normal prior to  Calling EMS 20 min.   Admits to multiple drug uise, marijuana, coke, mushrooms, ETOH use Pt alert on arrival and cooperative.

## 2019-02-05 NOTE — ED Notes (Signed)
Encouraged pt to stay, pt adamantly refusing. Pt aware of the risk of leaving against medical advice.

## 2019-02-12 ENCOUNTER — Emergency Department (HOSPITAL_COMMUNITY)
Admission: EM | Admit: 2019-02-12 | Discharge: 2019-02-12 | Discharge status: Against Medical Advice | Payer: BLUE CROSS/BLUE SHIELD | Attending: Emergency Medicine | Admitting: Emergency Medicine

## 2019-02-12 ENCOUNTER — Other Ambulatory Visit: Payer: Self-pay

## 2019-02-12 ENCOUNTER — Encounter (HOSPITAL_COMMUNITY): Payer: Self-pay | Admitting: Emergency Medicine

## 2019-02-12 DIAGNOSIS — F17290 Nicotine dependence, other tobacco product, uncomplicated: Secondary | ICD-10-CM | POA: Insufficient documentation

## 2019-02-12 DIAGNOSIS — Z5329 Procedure and treatment not carried out because of patient's decision for other reasons: Secondary | ICD-10-CM | POA: Diagnosis not present

## 2019-02-12 DIAGNOSIS — J45909 Unspecified asthma, uncomplicated: Secondary | ICD-10-CM | POA: Insufficient documentation

## 2019-02-12 DIAGNOSIS — F1910 Other psychoactive substance abuse, uncomplicated: Secondary | ICD-10-CM | POA: Diagnosis not present

## 2019-02-12 DIAGNOSIS — R4182 Altered mental status, unspecified: Secondary | ICD-10-CM | POA: Diagnosis present

## 2019-02-12 HISTORY — DX: Other psychoactive substance abuse, uncomplicated: F19.10

## 2019-02-12 LAB — CBC WITH DIFFERENTIAL/PLATELET
Abs Immature Granulocytes: 0.02 10*3/uL (ref 0.00–0.07)
Basophils Absolute: 0 10*3/uL (ref 0.0–0.1)
Basophils Relative: 1 %
Eosinophils Absolute: 0.3 10*3/uL (ref 0.0–0.5)
Eosinophils Relative: 5 %
HCT: 43.9 % (ref 36.0–46.0)
Hemoglobin: 13.9 g/dL (ref 12.0–15.0)
Immature Granulocytes: 0 %
Lymphocytes Relative: 37 %
Lymphs Abs: 2 10*3/uL (ref 0.7–4.0)
MCH: 30.3 pg (ref 26.0–34.0)
MCHC: 31.7 g/dL (ref 30.0–36.0)
MCV: 95.9 fL (ref 80.0–100.0)
Monocytes Absolute: 0.5 10*3/uL (ref 0.1–1.0)
Monocytes Relative: 9 %
Neutro Abs: 2.6 10*3/uL (ref 1.7–7.7)
Neutrophils Relative %: 48 %
Platelets: 266 10*3/uL (ref 150–400)
RBC: 4.58 MIL/uL (ref 3.87–5.11)
RDW: 12.5 % (ref 11.5–15.5)
WBC: 5.4 10*3/uL (ref 4.0–10.5)
nRBC: 0 % (ref 0.0–0.2)

## 2019-02-12 LAB — BASIC METABOLIC PANEL
Anion gap: 15 (ref 5–15)
BUN: 5 mg/dL — ABNORMAL LOW (ref 6–20)
CO2: 20 mmol/L — ABNORMAL LOW (ref 22–32)
Calcium: 9.4 mg/dL (ref 8.9–10.3)
Chloride: 105 mmol/L (ref 98–111)
Creatinine, Ser: 0.72 mg/dL (ref 0.44–1.00)
GFR calc Af Amer: >60 mL/min (ref >60)
GFR calc non Af Amer: >60 mL/min (ref >60)
Glucose, Bld: 61 mg/dL — ABNORMAL LOW (ref 70–99)
Potassium: 3.2 mmol/L — ABNORMAL LOW (ref 3.5–5.1)
Sodium: 140 mmol/L (ref 135–145)

## 2019-02-12 MED ORDER — NALOXONE HCL 4 MG/0.1ML NA LIQD
1.0000 | Freq: Once | NASAL | Status: AC
  Administered 2019-02-12: 1 via NASAL
  Filled 2019-02-12: qty 4

## 2019-02-12 MED ORDER — SODIUM CHLORIDE 0.9 % IV BOLUS: 1000.0000 mL | Freq: Once | INTRAVENOUS | Status: DC

## 2019-02-12 NOTE — ED Notes (Signed)
Patient requesting to leave.  

## 2019-02-12 NOTE — ED Triage Notes (Signed)
Per [facility] pt coming from home after found unresponsive after ingesting mushrooms, 2 lines of heroin and 2 glasses of tequila approx at 4:30 today. Patient was bagged by fire and given 1 mg narcan nasally and then 1 mg narcan IV when EMS arrived. Patient now alert and orientated. Reports muffled hearing.

## 2019-02-12 NOTE — ED Provider Notes (Signed)
[facility] EMERGENCY DEPARTMENT Provider Note   CSN: [id number] Arrival date & time: 02/12/19  1826    History   Chief Complaint Chief Complaint  Patient presents with  . Drug Overdose    HPI Jennifer Walton is a 23 y.o. female.     23 yo F with a cc of heroin overdose.  The patient states that she did a large amount of mushrooms and started freaking out so she started doing heroin, she then took too much and woke up with EMS.  She now feels a little bit out of it and would like to take a nap.  She denies any chest pain shortness of breath abdominal pain vomiting or diarrhea.  The history is provided by the patient.  Drug Overdose  This is a new problem. The current episode started yesterday. The problem occurs constantly. The problem has not changed since onset.Pertinent negatives include no chest pain, no headaches and no shortness of breath. Nothing aggravates the symptoms. Nothing relieves the symptoms. She has tried nothing for the symptoms. The treatment provided no relief.  Illness  Associated symptoms: no chest pain, no congestion, no fever, no headaches, no myalgias, no nausea, no rhinorrhea, no shortness of breath, no vomiting and no wheezing     Past Medical History:  Diagnosis Date  . Anxiety   . Asthma   . Polysubstance abuse [facility])     Patient Active Problem List   Diagnosis Date Noted  . 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 KNEE SURGERY       OB History    Gravida  1   Para      Term      Preterm      AB      Living        SAB      TAB      Ectopic      Multiple      Live Births               Home Medications    Prior to Admission medications   Medication Sig Start Date End Date Taking? Authorizing Provider  Naloxone HCl 0.4 MG/0.4ML SOAJ Please use as indicated 02/05/19   Andee Poles, MD   ondansetron (ZOFRAN ODT) 4 MG disintegrating tablet Take 1 tablet (4 mg total) by mouth every 8 (eight) hours as needed for nausea or vomiting. 12/30/18   Antonietta Breach, PA-C  promethazine (PHENERGAN) 25 MG tablet Take 1 tablet (25 mg total) by mouth every 6 (six) hours as needed for nausea or vomiting. 12/28/18   Molpus, Jenny Reichmann, MD    Family History Family History  Problem Relation Age of Onset  . Healthy Mother     Social History Social History   Tobacco Use  . Smoking status: Current Every Day Smoker    Packs/day: 0.20    Types: E-cigarettes  . Smokeless tobacco: Never Used  Substance Use Topics  . Alcohol use: Yes    Comment: few times a month  . Drug use: No     Allergies   Patient has no known allergies.   Review of Systems Review of Systems  Constitutional: Negative for chills and fever.  HENT: Negative for congestion and rhinorrhea.   Eyes: Negative for redness and visual disturbance.  Respiratory: Negative for shortness of breath and wheezing.   Cardiovascular: Negative for chest pain  and palpitations.  Gastrointestinal: Negative for nausea and vomiting.  Genitourinary: Negative for dysuria and urgency.  Musculoskeletal: Negative for arthralgias and myalgias.  Skin: Negative for pallor and wound.  Neurological: Negative for dizziness and headaches.     Physical Exam Updated Vital Signs BP (!) 138/107   Pulse (!) 117   Resp 18   Ht 5' 9" (1.753 m)   Wt 62 kg   SpO2 100%   BMI 20.18 kg/m   Physical Exam Vitals signs and nursing note reviewed.  Constitutional:      General: She is not in acute distress.    Appearance: She is well-developed. She is not diaphoretic.  HENT:     Head: Normocephalic and atraumatic.  Eyes:     Pupils: Pupils are equal, round, and reactive to light.  Neck:     Musculoskeletal: Normal range of motion and neck supple.  Cardiovascular:     Rate and Rhythm: Regular rhythm. Tachycardia present.     Heart sounds: No murmur. No  friction rub. No gallop.   Pulmonary:     Effort: Pulmonary effort is normal.     Breath sounds: No wheezing or rales.  Abdominal:     General: There is no distension.     Palpations: Abdomen is soft.     Tenderness: There is no abdominal tenderness.  Musculoskeletal:        General: No tenderness.  Skin:    General: Skin is warm and dry.  Neurological:     Mental Status: She is alert and oriented to person, place, and time.  Psychiatric:        Behavior: Behavior normal.      ED Treatments / Results  Labs (all labs ordered are listed, but only abnormal results are displayed) Labs Reviewed  CBC WITH DIFFERENTIAL/PLATELET  BASIC METABOLIC PANEL    EKG EKG Interpretation  Date/Time:  Monday February 12 2019 18:36:38 EDT Ventricular Rate:  115 PR Interval:    QRS Duration: 89 QT Interval:  337 QTC Calculation: 467 R Axis:   84 Text Interpretation:  Sinus tachycardia Left atrial enlargement RSR' in V1 or V2, probably normal variant No significant change since last tracing Confirmed by Deno Etienne [phone]) on 02/12/2019 6:53:10 PM   Radiology No results found.  Procedures Procedures (including critical care time)  Medications Ordered in ED Medications  sodium chloride 0.9 % bolus 1,000 mL (has no administration in time range)  naloxone (NARCAN) nasal spray 4 mg/0.1 mL (has no administration in time range)     Initial Impression / Assessment and Plan / ED Course  I have reviewed the triage vital signs and the nursing notes.  Pertinent labs & imaging results that were available during my care of the patient were reviewed by me and considered in my medical decision making (see chart for details).        23 yo F with a chief complaint of unintentional drug overdose.  The patient overdosed on heroin after doing some hallucinogenic mushrooms.  She currently is awake and alert, will observe in the ED for a short period to assess for no continued issues from opiate  overdose.  The patient after thinking about it in the room has decided to not stay in the ED to be observed.  Discussed with her that she may have a repeat episode once the Narcan wears off.  She is not willing to stay.  Giving a home Narcan kit, given resources.  Signed out AGAINST MEDICAL  ADVICE.  Offered her to return anytime she changes her mind.  7:01 PM:  I have discussed the diagnosis/risks/treatment options with the patient and believe the pt to be eligible for discharge home to follow-up with PCP. We also discussed returning to the ED immediately if new or worsening sx occur. We discussed the sx which are most concerning (e.g., sudden worsening pain, fever, inability to tolerate by mouth) that necessitate immediate return. Medications administered to the patient during their visit and any new prescriptions provided to the patient are listed below.  Medications given during this visit Medications  sodium chloride 0.9 % bolus 1,000 mL (has no administration in time range)  naloxone (NARCAN) nasal spray 4 mg/0.1 mL (has no administration in time range)     The patient appears reasonably screen and/or stabilized for discharge and I doubt any other medical condition or other [facility] requiring further screening, evaluation, or treatment in the ED at this time prior to discharge.    Final Clinical Impressions(s) / ED Diagnoses   Final diagnoses:  Polysubstance abuse [facility])    ED Discharge Orders    None       Deno Etienne, DO 02/12/19 1901

## 2019-02-12 NOTE — Discharge Instructions (Signed)
There is help if you need it.  Please do not use dirty needles, this could cause you a severe infection to your skin, heart or spinal cord.  This could kill you or leave you permanently disabled.    There has been a recent study that showed if you needed narcan that you have a 12% chance of dying in the next year.  Please quit if you are able.    [facility] Solution to the Opioid Problem (GCSTOP) Fixed; mobile; peer-based Chase Holleman [phone] [email or web address] Fixed site exchange at [facility], Wednesdays (2-5pm) and Thursdays (3-8pm). [address] [place], [place] [address] Call or text to arrange mobile and peer exchange, Mondays (1-4pm) and Fridays (4-7pm). Serving [place]

## 2019-08-01 ENCOUNTER — Other Ambulatory Visit: Payer: Self-pay

## 2019-08-01 ENCOUNTER — Emergency Department (HOSPITAL_COMMUNITY)
Admission: EM | Admit: 2019-08-01 | Discharge: 2019-08-01 | Disposition: A | Discharge status: Home / Self Care | Payer: BC Managed Care – PPO | Attending: Emergency Medicine | Admitting: Emergency Medicine

## 2019-08-01 ENCOUNTER — Encounter (HOSPITAL_COMMUNITY): Payer: Self-pay | Admitting: Emergency Medicine

## 2019-08-01 DIAGNOSIS — F17200 Nicotine dependence, unspecified, uncomplicated: Secondary | ICD-10-CM | POA: Diagnosis not present

## 2019-08-01 DIAGNOSIS — T40601A Poisoning by unspecified narcotics, accidental (unintentional), initial encounter: Secondary | ICD-10-CM | POA: Diagnosis not present

## 2019-08-01 LAB — CBC WITH DIFFERENTIAL/PLATELET
Abs Immature Granulocytes: 0.02 10*3/uL (ref 0.00–0.07)
Basophils Absolute: 0 10*3/uL (ref 0.0–0.1)
Basophils Relative: 0 %
Eosinophils Absolute: 0.4 10*3/uL (ref 0.0–0.5)
Eosinophils Relative: 4 %
HCT: 39.3 % (ref 36.0–46.0)
Hemoglobin: 13 g/dL (ref 12.0–15.0)
Immature Granulocytes: 0 %
Lymphocytes Relative: 47 %
Lymphs Abs: 5.1 10*3/uL — ABNORMAL HIGH (ref 0.7–4.0)
MCH: 32.1 pg (ref 26.0–34.0)
MCHC: 33.1 g/dL (ref 30.0–36.0)
MCV: 97 fL (ref 80.0–100.0)
Monocytes Absolute: 0.5 10*3/uL (ref 0.1–1.0)
Monocytes Relative: 4 %
Neutro Abs: 4.9 10*3/uL (ref 1.7–7.7)
Neutrophils Relative %: 45 %
Platelets: 268 10*3/uL (ref 150–400)
RBC: 4.05 MIL/uL (ref 3.87–5.11)
RDW: 12 % (ref 11.5–15.5)
WBC: 11.1 10*3/uL — ABNORMAL HIGH (ref 4.0–10.5)
nRBC: 0 % (ref 0.0–0.2)

## 2019-08-01 LAB — COMPREHENSIVE METABOLIC PANEL
ALT: 13 U/L (ref 0–44)
AST: 26 U/L (ref 15–41)
Albumin: 4.5 g/dL (ref 3.5–5.0)
Alkaline Phosphatase: 32 U/L — ABNORMAL LOW (ref 38–126)
Anion gap: 13 (ref 5–15)
BUN: 6 mg/dL (ref 6–20)
CO2: 23 mmol/L (ref 22–32)
Calcium: 9.2 mg/dL (ref 8.9–10.3)
Chloride: 100 mmol/L (ref 98–111)
Creatinine, Ser: 0.89 mg/dL (ref 0.44–1.00)
GFR calc Af Amer: >60 mL/min (ref >60)
GFR calc non Af Amer: >60 mL/min (ref >60)
Glucose, Bld: 258 mg/dL — ABNORMAL HIGH (ref 70–99)
Potassium: 3.5 mmol/L (ref 3.5–5.1)
Sodium: 136 mmol/L (ref 135–145)
Total Bilirubin: 1.2 mg/dL (ref 0.3–1.2)
Total Protein: 7.2 g/dL (ref 6.5–8.1)

## 2019-08-01 LAB — RAPID URINE DRUG SCREEN, HOSP PERFORMED
Amphetamines: NOT DETECTED
Barbiturates: NOT DETECTED
Benzodiazepines: NOT DETECTED
Cocaine: POSITIVE — AB
Opiates: NOT DETECTED
Tetrahydrocannabinol: POSITIVE — AB

## 2019-08-01 LAB — I-STAT BETA HCG BLOOD, ED (MC, WL, AP ONLY): I-stat hCG, quantitative: <5 m[IU]/mL (ref <5)

## 2019-08-01 LAB — ETHANOL: Alcohol, Ethyl (B): 15 mg/dL — ABNORMAL HIGH (ref <10)

## 2019-08-01 LAB — SALICYLATE LEVEL: Salicylate Lvl: <7 mg/dL (ref 2.8–30.0)

## 2019-08-01 LAB — ACETAMINOPHEN LEVEL: Acetaminophen (Tylenol), Serum: <10 ug/mL — ABNORMAL LOW (ref 10–30)

## 2019-08-01 MED ORDER — ONDANSETRON HCL 4 MG/2ML IJ SOLN
4.0000 mg | Freq: Once | INTRAMUSCULAR | Status: AC
  Administered 2019-08-01: 4 mg via INTRAVENOUS

## 2019-08-01 MED ORDER — NALOXONE HCL 2 MG/2ML IJ SOSY
2.0000 mg | PREFILLED_SYRINGE | Freq: Once | INTRAMUSCULAR | Status: AC
  Administered 2019-08-01: 2 mg via INTRAVENOUS

## 2019-08-01 NOTE — ED Provider Notes (Signed)
[place] EMERGENCY DEPARTMENT Provider Note   CSN: [id number] Arrival date & time: 08/01/19  0050     History   Chief Complaint Chief Complaint  Patient presents with   Drug Overdose    HPI Jennifer Walton is a 23 y.o. female.     The history is provided by the patient and medical records.  Drug Overdose     0125--called to patient's room in triage where she was found to be unresponsive.  Respirations slowed, pulse remained intact. O2 sats dropped to 47% and placed on NRB.  Pupils were pinpoint.  She was given 2 mg Narcan with immediate return of consciousness and spontaneous breathing.  She reports that she took half of a 30 mg Percocet prior to arrival along with some wine.  She denies other ingestion.  Patient transferred to treatment room 35, awake but drowsy.  VSS at this time.  Triage RN reports that boyfriend is in lobby.  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 Authorizing Provider  Naloxone HCl 0.4 MG/0.4ML SOAJ Please use as indicated 02/05/19   Andee Poles, MD  ondansetron (ZOFRAN ODT) 4 MG disintegrating tablet Take 1 tablet (4 mg total) by mouth every 8 (eight) hours as needed for nausea or vomiting. 12/30/18   Antonietta Breach, PA-C  promethazine (PHENERGAN) 25 MG tablet Take 1 tablet (25 mg total) by mouth every 6 (six) hours as needed for nausea or vomiting. 12/28/18   Molpus, Jenny Reichmann, MD     Family History Family History  Problem Relation Age of Onset   Healthy Mother     Social History Social History   Tobacco Use   Smoking status: Current Every Day Smoker    Packs/day: 0.20    Types: E-cigarettes   Smokeless tobacco: Never Used  Substance Use Topics   Alcohol use: Yes    Comment: few times a month   Drug use: Yes     Allergies   Patient has no known allergies.   Review of Systems Review of Systems  Unable to perform ROS: Other   OVERDOSE  Physical Exam Updated Vital Signs BP 126/86 (BP Location: Right Arm)    Pulse 89    Temp 97.7 F (36.5 C) (Oral)    Resp 16    SpO2 99%   Physical Exam Vitals signs and nursing note reviewed.  Constitutional:      Appearance: She is well-developed. She is diaphoretic.     Comments: Diaphoretic, apneic, drooling, unresponsive to sternal rub  HENT:     Head: Normocephalic and atraumatic.  Eyes:  Conjunctiva/sclera: Conjunctivae normal.     Pupils: Pupils are equal, round, and reactive to light.     Comments: Pinpoint pupils but symmetric  Neck:     Musculoskeletal: Normal range of motion.  Cardiovascular:     Rate and Rhythm: Normal rate and regular rhythm.     Heart sounds: Normal heart sounds.  Pulmonary:     Comments: Apenic, drooling Abdominal:     General: Bowel sounds are normal.     Palpations: Abdomen is soft.  Musculoskeletal:     Comments: No track marks noted on extremities  Skin:    General: Skin is warm and moist.      ED Treatments / Results  Labs (all labs ordered are listed, but only abnormal results are displayed) Labs Reviewed  CBC WITH DIFFERENTIAL/PLATELET - Abnormal; Notable for the following components:      Result Value   WBC 11.1 (*)    Lymphs Abs 5.1 (*)    All other components within normal limits  ETHANOL - Abnormal; Notable for the following components:   Alcohol, Ethyl (B) 15 (*)    All other components within normal limits  RAPID URINE DRUG SCREEN,  HOSP PERFORMED - Abnormal; Notable for the following components:   Cocaine POSITIVE (*)    Tetrahydrocannabinol POSITIVE (*)    All other components within normal limits  ACETAMINOPHEN LEVEL - Abnormal; Notable for the following components:   Acetaminophen (Tylenol), Serum <10 (*)    All other components within normal limits  COMPREHENSIVE METABOLIC PANEL - Abnormal; Notable for the following components:   Glucose, Bld 258 (*)    Alkaline Phosphatase 32 (*)    All other components within normal limits  SALICYLATE LEVEL  I-STAT BETA HCG BLOOD, ED ([facility], [facility], [facility] ONLY)    EKG None  Radiology No results found.  Procedures Procedures (including critical care time)  CRITICAL CARE Performed by: Garlon Hatchet   Total critical care time: 45 minutes  Critical care time was exclusive of separately billable procedures and treating other patients.  Critical care was necessary to treat or prevent imminent or life-threatening deterioration.  Critical care was time spent personally by me on the following activities: development of treatment plan with patient and/or surrogate as well as nursing, discussions with consultants, evaluation of patient's response to treatment, examination of patient, obtaining history from patient or surrogate, ordering and performing treatments and interventions, ordering and review of laboratory studies, ordering and review of radiographic studies, pulse oximetry and re-evaluation of patient's condition.   Medications Ordered in ED Medications  naloxone (NARCAN) injection 2 mg (2 mg Intravenous Given 08/01/19 0130)  ondansetron (ZOFRAN) injection 4 mg (4 mg Intravenous Given 08/01/19 0130)     Initial Impression / Assessment and Plan / ED Course  I have reviewed the triage vital signs and the nursing notes.  Pertinent labs & imaging results that were available during my care of the patient were reviewed by me and considered in my medical decision making (see  chart for details).  Patient initially seen in triage at 0125 when I was called into room due to unresponsiveness.  She was apparently awake and conversant during her initial triage but was waiting for room placement.  On my assessment she was unresponsive, drooling, apneic, pinpoint pupils, and O2 sats had dropped into the 40s.  She was placed on nonrebreather, suctioned, and given 2 mg IV Narcan with almost immediate improvement of her responsiveness.  O2 sats quickly improved into the  90s and ultimately 100%.  She was transferred to room 35.  There she was able to tell me that she drank some wine and took half of a 30 mg Percocet that she got from a friend.  She denies any other coingestions.  Labs are pending.  Patient's screening labs are overall reassuring.  Her UDS is positive for THC and cocaine.  Ethanol is only 15.  Patient is little drowsy at this time.  Will monitor.  6:31 AM Patient has remained stable overnight, has not required further doses of narcan.  Vitals have been stable.  She is AAOx3 at this time.  She denies that this was a suicide attempt, they were using percocet recreationally.  States she used to take "perc 10's" a while back recreationally but has been clean for several months so thinks she overdid it.  She adamantly denies SI/HI/AVH at this time. I have made her aware of the dangers of prescription and illicit drugs, especially opiates and risk of accidental OD.  She acknowledges understanding of this.  Boyfriend here at bedside also voiced understanding.  He will be taking her home and staying with her today.  She may return here for any new/acute changes.  Final Clinical Impressions(s) / ED Diagnoses   Final diagnoses:  Opiate overdose, accidental or unintentional, initial encounter [facility](HCC)    ED Discharge Orders    None       Garlon HatchetSanders, Polly Barner M, PA-C 08/01/19 [phone]    Nira Connardama, Pedro Eduardo, MD 08/01/19 [phone]

## 2019-08-01 NOTE — ED Triage Notes (Addendum)
Pt presents with friends who state she is overdosing. Pt states she only took "half a percocet" and "drank some wine", denies other drug use. Pt drowsy, arouses to voice.

## 2019-08-01 NOTE — ED Notes (Signed)
Pt became apneic in triage, sats 47% with good waveform; 2mg  Narcan given, pt responsive but lethargic

## 2019-08-01 NOTE — Discharge Instructions (Signed)
Refrain from using recreational drugs or prescription medications that are not prescribed to you. These are highly addictive and can result in overdose again. Return here for any new/acute changes.

## 2020-04-17 ENCOUNTER — Other Ambulatory Visit: Payer: Self-pay

## 2020-04-17 ENCOUNTER — Emergency Department (HOSPITAL_COMMUNITY)
Admission: EM | Admit: 2020-04-17 | Discharge: 2020-04-17 | Disposition: A | Discharge status: Home / Self Care | Payer: BC Managed Care – PPO | Attending: Emergency Medicine | Admitting: Emergency Medicine

## 2020-04-17 DIAGNOSIS — F17200 Nicotine dependence, unspecified, uncomplicated: Secondary | ICD-10-CM | POA: Diagnosis not present

## 2020-04-17 DIAGNOSIS — Z79899 Other long term (current) drug therapy: Secondary | ICD-10-CM | POA: Diagnosis not present

## 2020-04-17 DIAGNOSIS — R1084 Generalized abdominal pain: Secondary | ICD-10-CM | POA: Diagnosis not present

## 2020-04-17 DIAGNOSIS — R112 Nausea with vomiting, unspecified: Secondary | ICD-10-CM | POA: Insufficient documentation

## 2020-04-17 LAB — CBC
HCT: 38 % (ref 36.0–46.0)
Hemoglobin: 12.8 g/dL (ref 12.0–15.0)
MCH: 30.9 pg (ref 26.0–34.0)
MCHC: 33.7 g/dL (ref 30.0–36.0)
MCV: 91.8 fL (ref 80.0–100.0)
Platelets: 344 10*3/uL (ref 150–400)
RBC: 4.14 MIL/uL (ref 3.87–5.11)
RDW: 12 % (ref 11.5–15.5)
WBC: 8.9 10*3/uL (ref 4.0–10.5)
nRBC: 0 % (ref 0.0–0.2)

## 2020-04-17 LAB — COMPREHENSIVE METABOLIC PANEL
ALT: 26 U/L (ref 0–44)
AST: 28 U/L (ref 15–41)
Albumin: 4.5 g/dL (ref 3.5–5.0)
Alkaline Phosphatase: 35 U/L — ABNORMAL LOW (ref 38–126)
Anion gap: 13 (ref 5–15)
BUN: 8 mg/dL (ref 6–20)
CO2: 20 mmol/L — ABNORMAL LOW (ref 22–32)
Calcium: 9.6 mg/dL (ref 8.9–10.3)
Chloride: 105 mmol/L (ref 98–111)
Creatinine, Ser: 0.74 mg/dL (ref 0.44–1.00)
GFR calc Af Amer: >60 mL/min (ref >60)
GFR calc non Af Amer: >60 mL/min (ref >60)
Glucose, Bld: 118 mg/dL — ABNORMAL HIGH (ref 70–99)
Potassium: 3.4 mmol/L — ABNORMAL LOW (ref 3.5–5.1)
Sodium: 138 mmol/L (ref 135–145)
Total Bilirubin: 1.4 mg/dL — ABNORMAL HIGH (ref 0.3–1.2)
Total Protein: 7.3 g/dL (ref 6.5–8.1)

## 2020-04-17 LAB — LIPASE, BLOOD: Lipase: 27 U/L (ref 11–51)

## 2020-04-17 LAB — I-STAT BETA HCG BLOOD, ED (MC, WL, AP ONLY): I-stat hCG, quantitative: <5 m[IU]/mL (ref <5)

## 2020-04-17 MED ORDER — ONDANSETRON HCL 4 MG/2ML IJ SOLN
4.0000 mg | Freq: Once | INTRAMUSCULAR | Status: AC
  Administered 2020-04-17: 4 mg via INTRAVENOUS
  Filled 2020-04-17: qty 2

## 2020-04-17 MED ORDER — KETOROLAC TROMETHAMINE 30 MG/ML IJ SOLN
30.0000 mg | Freq: Once | INTRAMUSCULAR | Status: AC
  Administered 2020-04-17: 30 mg via INTRAVENOUS
  Filled 2020-04-17: qty 1

## 2020-04-17 MED ORDER — ONDANSETRON 4 MG PO TBDP
4.0000 mg | ORAL_TABLET | Freq: Once | ORAL | Status: AC | PRN
  Administered 2020-04-17: 4 mg via ORAL
  Filled 2020-04-17: qty 1

## 2020-04-17 MED ORDER — SODIUM CHLORIDE 0.9 % IV BOLUS
1000.0000 mL | Freq: Once | INTRAVENOUS | Status: AC
  Administered 2020-04-17: 1000 mL via INTRAVENOUS

## 2020-04-17 NOTE — ED Notes (Signed)
Pt provided with foods/fluids for PO challenge

## 2020-04-17 NOTE — Discharge Instructions (Signed)
Make sure you are drinking plenty of fluids and staying hydrated.  Follow-up with [facility] to establish a primary care doctor if you do not have one.   Return to the emergency department for any worsening pain, vomiting, fevers or any other worsening concerning symptoms.

## 2020-04-17 NOTE — ED Triage Notes (Signed)
[place] generalized abdominal pain and vomiting. Pt started her hearing and then began vomiting. Pt is sticking her whole hand into her mouth and throat, precipitating vomiting/dry heaving.

## 2020-04-17 NOTE — ED Notes (Signed)
Pt refused temperature check.  

## 2020-04-17 NOTE — ED Provider Notes (Signed)
[facility] DEPARTMENT Provider Note   CSN: [id number] Arrival date & time: 04/17/20  1023     History Chief Complaint  Patient presents with  . Emesis    Jennifer Walton is a 24 y.o. female possible history of anxiety, asthma, polysubstance abuse who presents for evaluation of nausea/vomiting that began at 5 AM this morning.  She reports several episodes of nonbloody, nonbilious emesis since then.  She reports she has not been able to tolerate tolerate much p.o.  Patient also reports she started developing some generalized abdominal pain that started after she vomited.  Patient states that she has not had any diarrhea.  She states she did not eat any abnormal food.  No other sick contacts.  She states that this is happened to her before and she gets dehydrated.  She denies any fevers, chest pain, difficulty breathing, dysuria, hematuria, diarrhea.  The history is provided by the patient.       Past Medical History:  Diagnosis Date  . Anxiety   . Asthma   . Polysubstance abuse [facility])     Patient Active Problem List   Diagnosis Date Noted  . 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 KNEE SURGERY       OB History    Gravida  1   Para      Term      Preterm      AB      Living        SAB      TAB      Ectopic      Multiple      Live Births              Family History  Problem Relation Age of Onset  . Healthy Mother     Social History   Tobacco Use  . Smoking status: Current Every Day Smoker    Packs/day: 0.20    Types: E-cigarettes  . Smokeless tobacco: Never Used  Vaping Use  . Vaping Use: Every day  Substance Use Topics  . Alcohol use: Yes    Comment: few times a month  . Drug use: Yes    Home Medications Prior to Admission medications   Medication Sig Start Date End Date Taking? Authorizing Provider    escitalopram (LEXAPRO) 10 MG tablet Take 10 mg by mouth daily.    [provider]  etonogestrel (NEXPLANON) 68 MG IMPL implant 1 each by Subdermal route once.    [provider]  Naloxone HCl 0.4 MG/0.4ML SOAJ Please use as indicated Patient not taking: Reported on 08/01/2019 02/05/19   Andee Poles, MD  ondansetron (ZOFRAN ODT) 4 MG disintegrating tablet Take 1 tablet (4 mg total) by mouth every 8 (eight) hours as needed for nausea or vomiting. Patient not taking: Reported on 08/01/2019 12/30/18   Antonietta Breach, PA-C  ondansetron (ZOFRAN) 4 MG tablet Take 4 mg by mouth every 8 (eight) hours as needed for nausea or vomiting.    [provider]  promethazine (PHENERGAN) 25 MG tablet Take 1 tablet (25 mg total) by mouth every 6 (six) hours as needed for nausea or vomiting. Patient not taking: Reported on 08/01/2019 12/28/18   Molpus, John, MD  traZODone (DESYREL) 50 MG tablet Take 50 mg by mouth at bedtime.    [provider]    Allergies    Patient has  no known allergies.  Review of Systems   Review of Systems  Constitutional: Negative for fever.  Respiratory: Negative for cough and shortness of breath.   Cardiovascular: Negative for chest pain.  Gastrointestinal: Positive for abdominal pain, nausea and vomiting. Negative for diarrhea.  Genitourinary: Negative for dysuria and hematuria.  Neurological: Negative for headaches.  All other systems reviewed and are negative.   Physical Exam Updated Vital Signs BP 130/80 (BP Location: Right Arm)   Pulse 76   Temp 98.2 F (36.8 C) (Oral)   Resp 16   Ht 5\' 9"  (1.753 m)   Wt 68.9 kg   SpO2 100%   BMI 22.45 kg/m   Physical Exam Vitals and nursing note reviewed.  Constitutional:      Appearance: Normal appearance. She is well-developed.     Comments: NAD, actively dry heaving.   HENT:     Head: Normocephalic and atraumatic.  Eyes:     General: Lids are normal.     Conjunctiva/sclera:  Conjunctivae normal.     Pupils: Pupils are equal, round, and reactive to light.  Cardiovascular:     Rate and Rhythm: Normal rate and regular rhythm.     Pulses: Normal pulses.     Heart sounds: Normal heart sounds. No murmur heard.  No friction rub. No gallop.   Pulmonary:     Effort: Pulmonary effort is normal.     Breath sounds: Normal breath sounds.  Abdominal:     Palpations: Abdomen is soft. Abdomen is not rigid.     Tenderness: There is generalized abdominal tenderness. There is no guarding.     Comments: Abdomen soft, nondistended.  Generalized abdominal tenderness but no focal point.  No CVA tenderness.  No rigidity, guarding.  Musculoskeletal:        General: Normal range of motion.     Cervical back: Full passive range of motion without pain.  Skin:    General: Skin is warm and dry.     Capillary Refill: Capillary refill takes less than 2 seconds.  Neurological:     Mental Status: She is alert and oriented to person, place, and time.  Psychiatric:        Speech: Speech normal.     ED Results / Procedures / Treatments   Labs (all labs ordered are listed, but only abnormal results are displayed) Labs Reviewed  COMPREHENSIVE METABOLIC PANEL - Abnormal; Notable for the following components:      Result Value   Potassium 3.4 (*)    CO2 20 (*)    Glucose, Bld 118 (*)    Alkaline Phosphatase 35 (*)    Total Bilirubin 1.4 (*)    All other components within normal limits  LIPASE, BLOOD  CBC  URINALYSIS, ROUTINE W REFLEX MICROSCOPIC  I-STAT BETA HCG BLOOD, ED ([facility], [facility], [facility] ONLY)    EKG None  Radiology No results found.  Procedures Procedures (including critical care time)  Medications Ordered in ED Medications  ondansetron (ZOFRAN-ODT) disintegrating tablet 4 mg (4 mg Oral Given 04/17/20 1032)  ondansetron (ZOFRAN) injection 4 mg (4 mg Intravenous Given 04/17/20 2044)  sodium chloride 0.9 % bolus 1,000 mL (0 mLs Intravenous Stopped 04/17/20 2242)  ketorolac  (TORADOL) 30 MG/ML injection 30 mg (30 mg Intravenous Given 04/17/20 2044)    ED Course  I have reviewed the triage vital signs and the nursing notes.  Pertinent labs & imaging results that were available during my care of the patient were reviewed by me  and considered in my medical decision making (see chart for details).    MDM Rules/Calculators/A&P                          24 year old female who presents for evaluation of nausea/vomiting and generalized abdominal pain.  Reports nausea/vomiting began at 5 AM this morning.  Has not been able to tolerate p.o. since then.  On initially arrival, she is afebrile, nontoxic-appearing.  Vital signs are stable.  On exam, she has generalized abdominal tenderness with no focal point.  No rigidity, guarding.  Question if this is dehydration versus polysubstance abuse.  History/physical exam not concerning for appendicitis versus diverticulitis.  We will plan for labs.  Will give fluids, antiemetics and reassess.  I-STAT beta is negative.  Lipase is normal.  CBC without any significant leukocytosis or anemia.  CMP shows potassium 3.4.  Normal BUN and creatinine.  Reevaluation.  Patient is resting comfortably without any signs of distress.  Repeat abdominal exam is improved.  Patient able tolerate p.o. in the department any difficulty.  Patient states she feels better.  At this time, suspect this is most likely viral versus substance-induced.  History/physical exam concerning for pyelonephritis, appendicitis, diverticulitis.  No indication for CT abdomen pelvis as do not suspect surgical abdomen.  Portions of this note were generated with Scientist, clinical (histocompatibility and immunogenetics). Dictation errors may occur despite best attempts at proofreading.   Final Clinical Impression(s) / ED Diagnoses Final diagnoses:  Non-intractable vomiting with nausea, unspecified vomiting type    Rx / DC Orders ED Discharge Orders    None       Maxwell Caul, PA-C 04/18/20  0034    Pricilla Loveless, MD 04/18/20 1605

## 2020-10-10 IMAGING — CT CT HEAD WITHOUT CONTRAST
4 series · 16 of 47 positions shown, 18 images · non-contrast
Comparison: None.

CLINICAL DATA: Headache and syncope. History of polycystic kidney
disease. Rule out hemorrhage.

EXAM:
CT HEAD WITHOUT CONTRAST
TECHNIQUE: Contiguous axial images were obtained from the base of the skull
through the vertex without intravenous contrast.

[Series 3: head without (person_name) · axial · non-contrast · 0.43mm/px · z∈[-113,+7]mm · 7 of 33 slices shown, 9 images]
[im 5/33  brain]
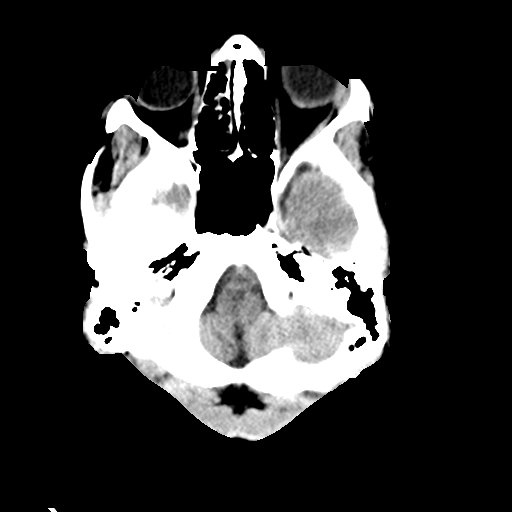
[im 5/33  bone]
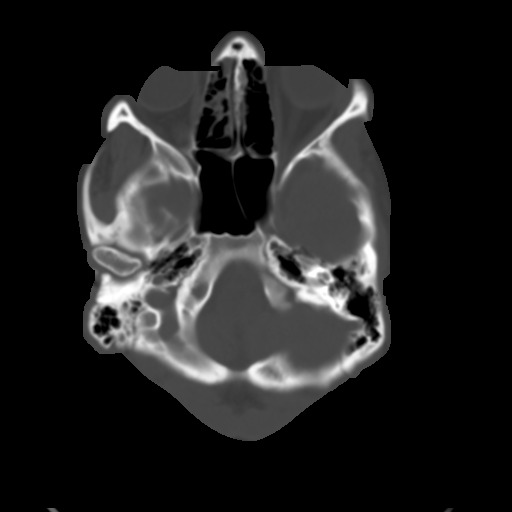
[im 9/33  brain]
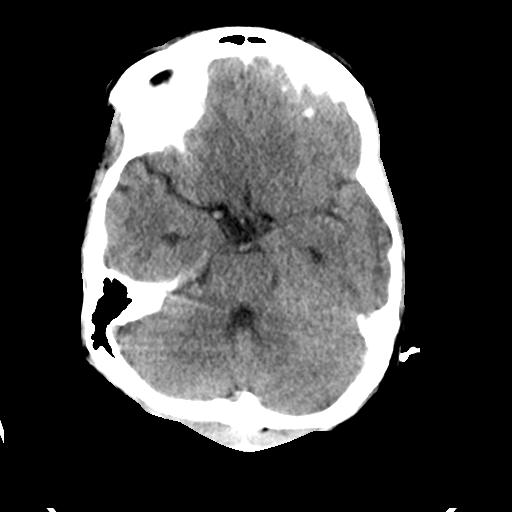
[im 13/33  brain]
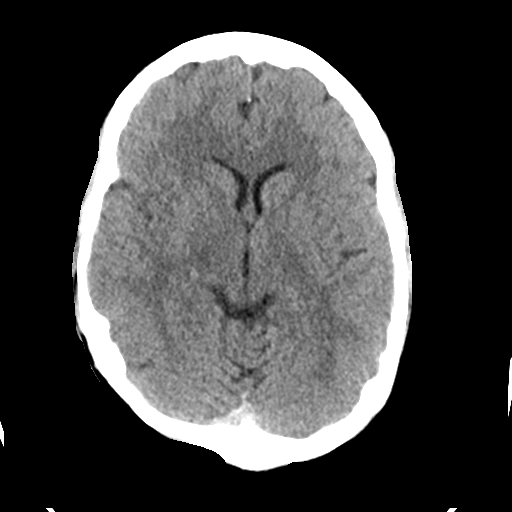
[im 17/33  brain]
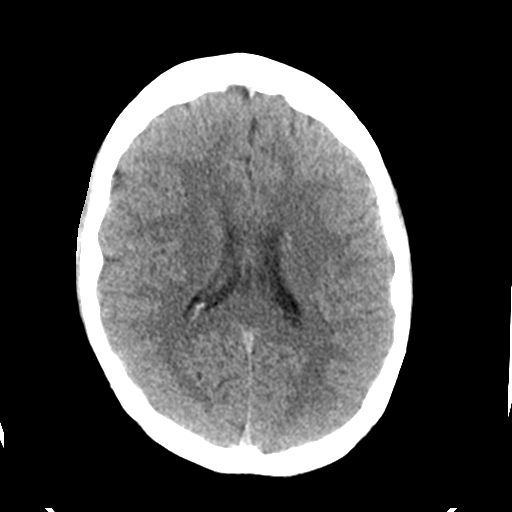
[im 21/33  brain]
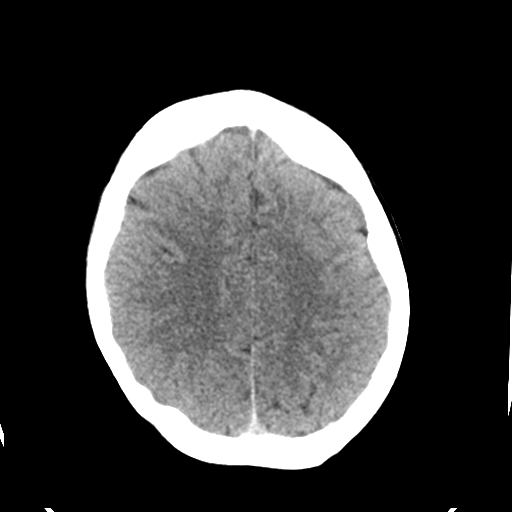
[im 21/33  bone]
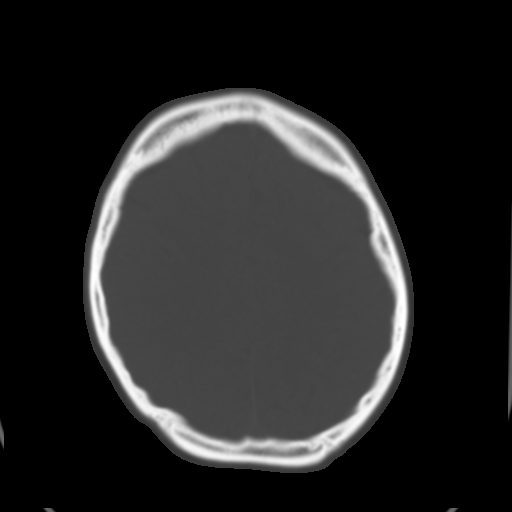
[im 25/33  brain]
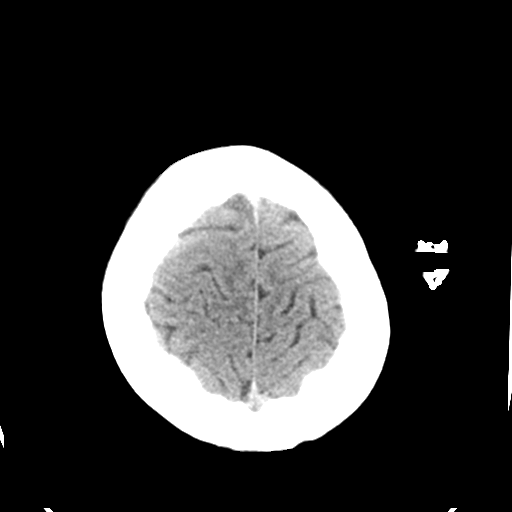
[im 29/33  brain]
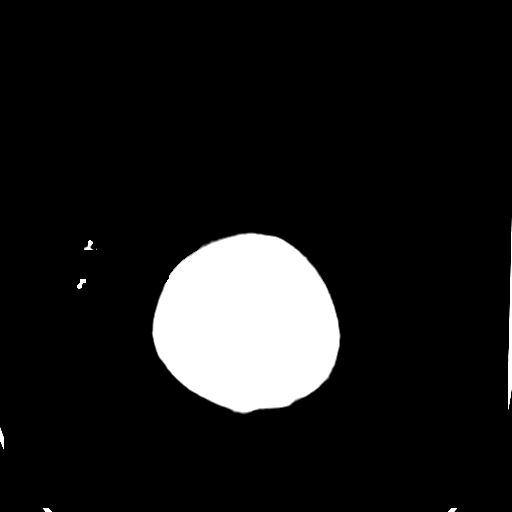

[Series 4: head bone · axial · 0.43mm/px · z∈[-117,-85]mm · 3 of 81 slices shown]
[im 9/81  bone]
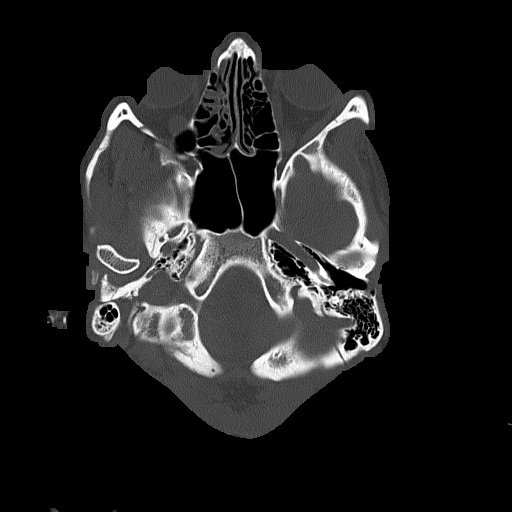
[im 17/81  bone]
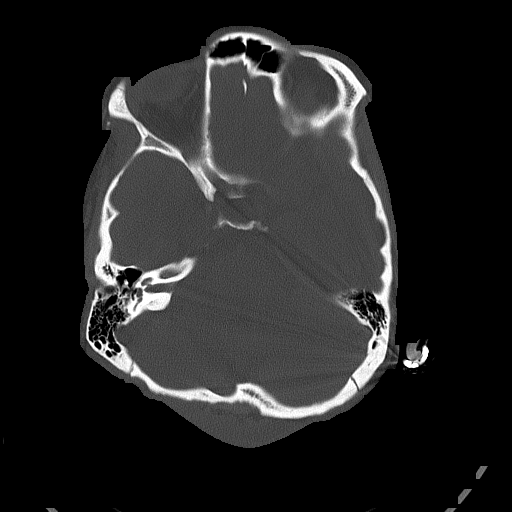
[im 25/81  bone]
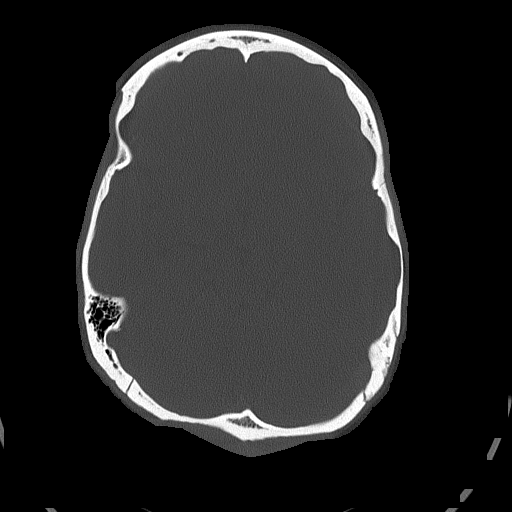

[Series 5: head without cor · coronal · non-contrast · 0.31mm/px · 3 of 67 slices shown]
[im 23/67  brain]
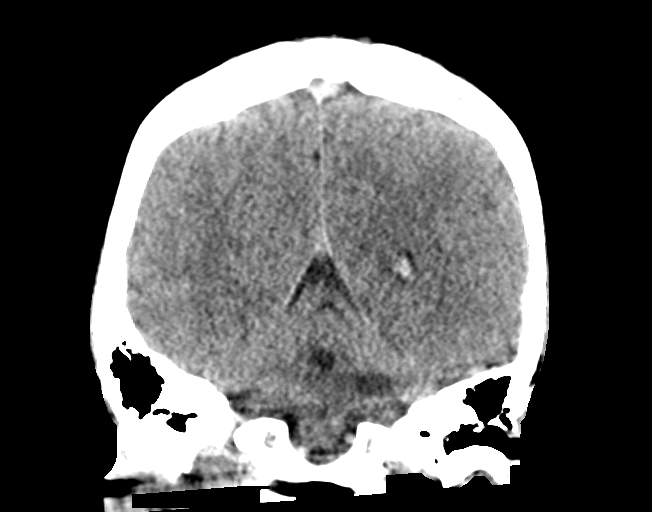
[im 30/67  brain]
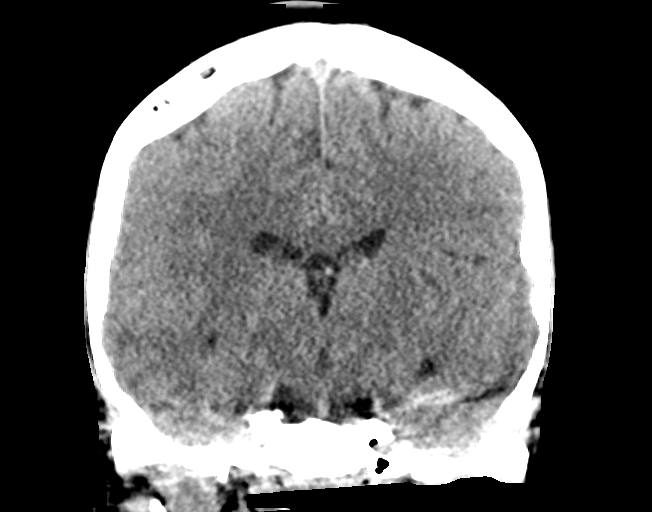
[im 37/67  brain]
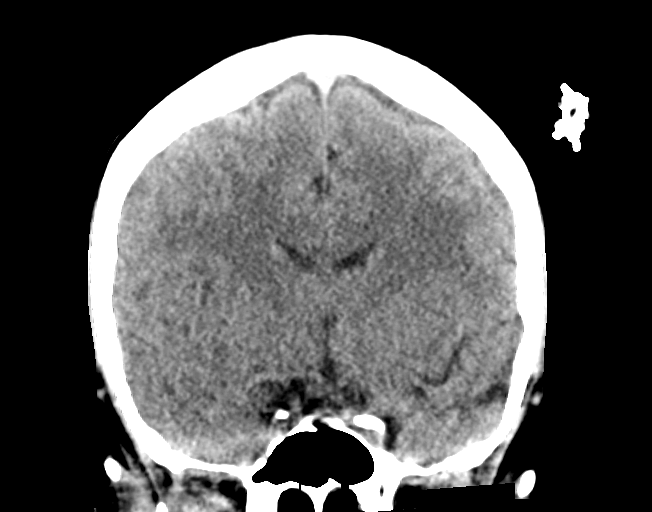

[Series 6: head without sag · sagittal · non-contrast · 0.31mm/px · 3 of 67 slices shown]
[im 23/67  brain]
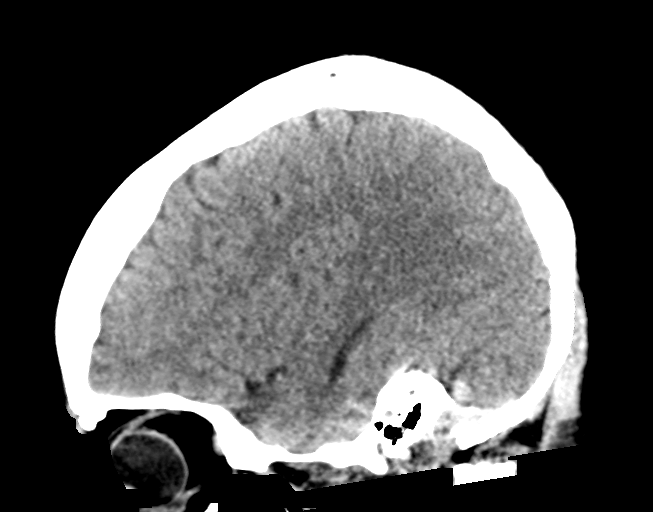
[im 34/67  brain]
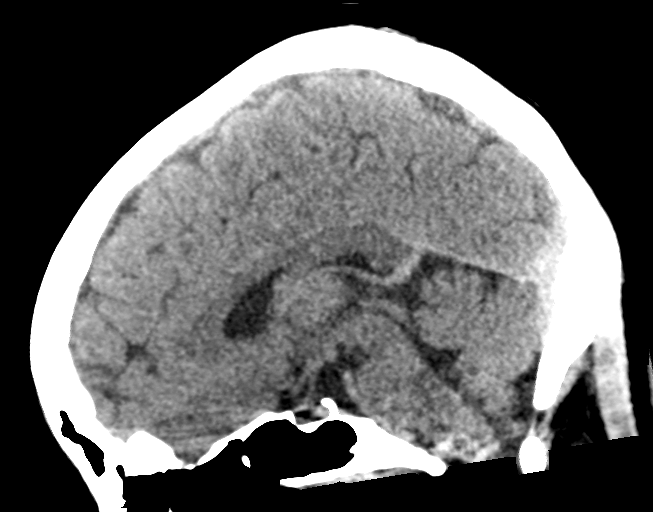
[im 45/67  brain]
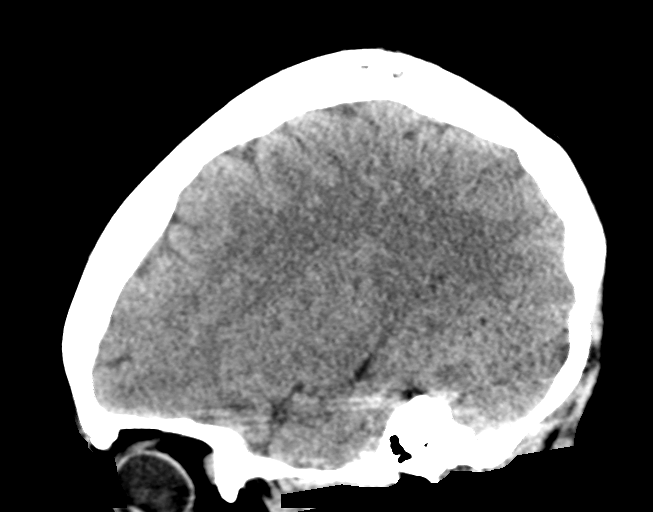

[16 of 47 positions shown; findings below may reference images not displayed]

FINDINGS: Brain: No evidence of acute infarction, hemorrhage, hydrocephalus,
extra-axial collection or mass lesion/mass effect.

Vascular: Negative for hyperdense vessel

Skull: Negative

Sinuses/Orbits: Mild mucosal edema paranasal sinuses. Negative orbit

Other: None
IMPRESSION: Negative CT head
